# Patient Record
Sex: Male | Born: 1955 | Race: White | Hispanic: No | Marital: Married | Smoking: Never smoker
Health system: Southern US, Community
[De-identification: ages and names within clinical notes are randomized; demographics above are authoritative.]

## PROBLEM LIST (undated history)

## (undated) DIAGNOSIS — I890 Lymphedema, not elsewhere classified: Secondary | ICD-10-CM

## (undated) DIAGNOSIS — C439 Malignant melanoma of skin, unspecified: Secondary | ICD-10-CM

## (undated) DIAGNOSIS — C801 Malignant (primary) neoplasm, unspecified: Secondary | ICD-10-CM

## (undated) DIAGNOSIS — E119 Type 2 diabetes mellitus without complications: Secondary | ICD-10-CM

## (undated) HISTORY — DX: Type 2 diabetes mellitus without complications: E11.9

## (undated) HISTORY — DX: Malignant (primary) neoplasm, unspecified: C80.1

## (undated) HISTORY — PX: EYE SURGERY: SHX253

## (undated) HISTORY — PX: OTHER SURGICAL HISTORY: SHX169

---

## 2015-01-12 LAB — HM COLONOSCOPY

## 2015-06-27 ENCOUNTER — Other Ambulatory Visit (HOSPITAL_COMMUNITY): Payer: Self-pay | Admitting: Oncology

## 2015-06-27 DIAGNOSIS — M1710 Unilateral primary osteoarthritis, unspecified knee: Secondary | ICD-10-CM | POA: Insufficient documentation

## 2015-06-27 DIAGNOSIS — M1712 Unilateral primary osteoarthritis, left knee: Secondary | ICD-10-CM | POA: Insufficient documentation

## 2015-06-27 DIAGNOSIS — C4362 Malignant melanoma of left upper limb, including shoulder: Secondary | ICD-10-CM

## 2015-06-30 ENCOUNTER — Encounter (HOSPITAL_COMMUNITY)
Admission: RE | Admit: 2015-06-30 | Discharge: 2015-06-30 | Disposition: A | Discharge status: Home / Self Care | Payer: 59 | Source: Ambulatory Visit | Attending: Oncology | Admitting: Oncology

## 2015-06-30 DIAGNOSIS — C4362 Malignant melanoma of left upper limb, including shoulder: Secondary | ICD-10-CM | POA: Diagnosis present

## 2015-06-30 LAB — GLUCOSE, CAPILLARY: GLUCOSE-CAPILLARY: 159 mg/dL — AB (ref 65–99)

## 2015-06-30 MED ORDER — FLUDEOXYGLUCOSE F - 18 (FDG) INJECTION
12.1000 | Freq: Once | INTRAVENOUS | Status: AC | PRN
  Administered 2015-06-30: 12.1 via INTRAVENOUS

## 2015-07-18 DIAGNOSIS — Z8619 Personal history of other infectious and parasitic diseases: Secondary | ICD-10-CM | POA: Insufficient documentation

## 2015-09-13 DIAGNOSIS — K529 Noninfective gastroenteritis and colitis, unspecified: Secondary | ICD-10-CM | POA: Insufficient documentation

## 2016-02-08 ENCOUNTER — Encounter (HOSPITAL_COMMUNITY): Payer: 59 | Attending: Hematology

## 2016-02-08 ENCOUNTER — Encounter (HOSPITAL_COMMUNITY): Payer: Self-pay | Admitting: Hematology

## 2016-02-08 ENCOUNTER — Encounter (HOSPITAL_COMMUNITY): Payer: 59 | Attending: Hematology | Admitting: Hematology

## 2016-02-08 VITALS — BP 124/73 | HR 73 | Temp 98.5°F | Resp 18 | Wt 218.4 lb

## 2016-02-08 DIAGNOSIS — C4362 Malignant melanoma of left upper limb, including shoulder: Secondary | ICD-10-CM | POA: Insufficient documentation

## 2016-02-08 DIAGNOSIS — I890 Lymphedema, not elsewhere classified: Secondary | ICD-10-CM | POA: Diagnosis not present

## 2016-02-08 DIAGNOSIS — L989 Disorder of the skin and subcutaneous tissue, unspecified: Secondary | ICD-10-CM

## 2016-02-08 DIAGNOSIS — C439 Malignant melanoma of skin, unspecified: Secondary | ICD-10-CM | POA: Insufficient documentation

## 2016-02-08 DIAGNOSIS — C779 Secondary and unspecified malignant neoplasm of lymph node, unspecified: Secondary | ICD-10-CM | POA: Insufficient documentation

## 2016-02-08 LAB — CBC WITH DIFFERENTIAL/PLATELET
BASOS PCT: 0 %
Basophils Absolute: 0 10*3/uL (ref 0.0–0.1)
EOS ABS: 0.3 10*3/uL (ref 0.0–0.7)
Eosinophils Relative: 4 %
HCT: 43.5 % (ref 39.0–52.0)
HEMOGLOBIN: 14.9 g/dL (ref 13.0–17.0)
Lymphocytes Relative: 28 %
Lymphs Abs: 2.3 10*3/uL (ref 0.7–4.0)
MCH: 32.6 pg (ref 26.0–34.0)
MCHC: 34.3 g/dL (ref 30.0–36.0)
MCV: 95.2 fL (ref 78.0–100.0)
MONO ABS: 0.8 10*3/uL (ref 0.1–1.0)
MONOS PCT: 9 %
NEUTROS PCT: 59 %
Neutro Abs: 4.7 10*3/uL (ref 1.7–7.7)
PLATELETS: 236 10*3/uL (ref 150–400)
RBC: 4.57 MIL/uL (ref 4.22–5.81)
RDW: 12.9 % (ref 11.5–15.5)
WBC: 8.1 10*3/uL (ref 4.0–10.5)

## 2016-02-08 LAB — COMPREHENSIVE METABOLIC PANEL
ALBUMIN: 4.2 g/dL (ref 3.5–5.0)
ALT: 16 U/L — ABNORMAL LOW (ref 17–63)
ANION GAP: 5 (ref 5–15)
AST: 16 U/L (ref 15–41)
Alkaline Phosphatase: 52 U/L (ref 38–126)
BUN: 16 mg/dL (ref 6–20)
CHLORIDE: 108 mmol/L (ref 101–111)
CO2: 26 mmol/L (ref 22–32)
Calcium: 9.4 mg/dL (ref 8.9–10.3)
Creatinine, Ser: 0.78 mg/dL (ref 0.61–1.24)
GFR calc Af Amer: >60 mL/min (ref >60)
Glucose, Bld: 134 mg/dL — ABNORMAL HIGH (ref 65–99)
POTASSIUM: 3.8 mmol/L (ref 3.5–5.1)
Sodium: 139 mmol/L (ref 135–145)
TOTAL PROTEIN: 7.2 g/dL (ref 6.5–8.1)
Total Bilirubin: 0.4 mg/dL (ref 0.3–1.2)

## 2016-02-08 LAB — RETICULOCYTES
RBC.: 4.57 MIL/uL (ref 4.22–5.81)
Retic Count, Absolute: 59.4 10*3/uL (ref 19.0–186.0)
Retic Ct Pct: 1.3 % (ref 0.4–3.1)

## 2016-02-08 LAB — LACTATE DEHYDROGENASE: LDH: 125 U/L (ref 98–192)

## 2016-02-08 LAB — TSH: TSH: 0.934 u[IU]/mL (ref 0.350–4.500)

## 2016-02-08 NOTE — Patient Instructions (Addendum)
[place] at [facility] Discharge Instructions  RECOMMENDATIONS MADE BY THE CONSULTANT AND ANY TEST RESULTS WILL BE SENT TO YOUR REFERRING PHYSICIAN.  You were seen by Dr. Irene Limbo today. Lab work today and again in 4 months. PET and CT scan in 1 week and 4 months. Office will schedule for you. Office will call you with results. Return to clinic in 4 months for follow-up. Call with any questions or concerns.   Thank you for choosing [place] at [facility] to provide your oncology and hematology care.  To afford each patient quality time with our provider, please arrive at least 15 minutes before your scheduled appointment time.   Beginning January 23rd 2017 lab work for the [facility] will be done in the  Main lab at [facility] on 1st floor. If you have a lab appointment with the [facility] please come in thru the  Main Entrance and check in at the main information desk  You need to re-schedule your appointment should you arrive 10 or more minutes late.  We strive to give you quality time with our providers, and arriving late affects you and other patients whose appointments are after yours.  Also, if you no show three or more times for appointments you may be dismissed from the clinic at the providers discretion.     Again, thank you for choosing [facility].  Our hope is that these requests will decrease the amount of time that you wait before being seen by our physicians.       _____________________________________________________________  Should you have questions after your visit to [facility], please contact our office at [phone] between the hours of 8:30 a.m. and 4:30 p.m.  Voicemails left after 4:30 p.m. will not be returned until the following business day.  For prescription refill requests, have your pharmacy contact our office.         Resources For Cancer Patients and their  Caregivers ? American Cancer Society: Can assist with transportation, wigs, general needs, runs Look Good Feel Better.        725-609-5728 ? Cancer Care: Provides financial assistance, online support groups, medication/co-pay assistance.  1-800-813-HOPE 989-382-7555) ? [place] Assists [place] cancer patients and their families through emotional , educational and financial support.  508-411-7266 ? [place] DSS Where to apply for food stamps, Medicaid and utility assistance. 380-634-8112 ? [employer]: Transportation to medical appointments. 4793627020 ? Social Security Administration: May apply for disability if have a Stage IV cancer. 610 847 7759 712 112 3827 ? [employer], Disability and Transit Services: Assists with nutrition, care and transit needs. [place] Support Programs: @10RELATIVEDAYS @ > Cancer Support Group  2nd Tuesday of the month 1pm-2pm, Journey Room  > Creative Journey  3rd Tuesday of the month 1130am-1pm, Journey Room  > Look Good Feel Better  1st Wednesday of the month 10am-12 noon, Journey Room (Call [place] to register [phone])

## 2016-02-16 ENCOUNTER — Encounter (HOSPITAL_COMMUNITY): Payer: 59

## 2016-03-05 NOTE — Progress Notes (Signed)
Marland Kitchen    HEMATOLOGY/ONCOLOGY CONSULTATION NOTE  Date of Service: 02/08/2016  Patient Care Team: Rory Percy, MD as PCP - General (Family Medicine)  Melanoma surgery: [facility]  CHIEF COMPLAINTS/PURPOSE OF CONSULTATION:  Further evaluation and management of malignant melanoma.   HISTORY OF PRESENTING ILLNESS:   Luis Fitzgerald is a wonderful 60 y.o. 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    ONCOLOGIC HISTORY Melanoma of forearm, left (*)  01/07/2015 Initial Diagnosis  Melanoma of forearm, left (*) status post biopsy  02/08/2015 Surgery  Wide local excision with sentinel lymph node biopsy, SLN with isolated tumor cells in one out of one lymph node .  03/01/2015 Surgery  Completion lymph node dissection from the left axilla consisting of 10 level 1/2 lymph nodes, 2 level II lymph nodes and one level III lymph node neg  03/01/2015 - 03/07/2015 Cancer Staged  MRI scan of the brain negative for metastatic disease, PET/CT scan negative for metastatic disease. PET scan reveals "reactive" high cervical lymph node only, thereby giving him T3b, N1a, M0 (stage III B)  07/19/2015 - Targeted Therapy  Ipilimumab 10 mg/kg, first 07/19/2015 every 2 weeks  Patient received 2 doses of ipilimumab 10 mg/kg as adjuvant therapy [last dose in January 2017] but developed grade 3 toxicity of the skin and high-grade fevers requiring high-dose steroid therapy for about 5 weeks. He has since been tapered off the steroids.  He was last seen by Dr. Tressie Stalker on 11/21/2015.   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 He does have some issues with left upper extremity lymphedema for which he has a lymphedema sleeve that he does not appear to be using regularly. He uses lymphedema pumps 3 times a week. He has elevated blood sugars and notes that he is trying to follow a diabetic  diet. Follows with Dr. Nevada Crane for his dermatology follow-up and skin screening. Reports that he saw him for a left scalp lesion around April May 2017 - pathology results not available to [place] at this time. Has been trying to take appropriate sun precautions. Notes issues with chronic knee pains for which he reports he has received cortisone shots.  Patient is here for his continued follow-up and management. He notes no acute new focal symptoms. Has not noted any new progressive or concerning skin lesions.   MEDICAL HISTORY:  Past Medical History:  Diagnosis Date  . Cancer (HCC)    melanoma  -Grade 3 rash due to Ipilimumab -Stage IIIB melanoma with oncologic history as noted above -Diabetes mellitus -Dyslipidemia -Blind in the left eye -Arthritis  SURGICAL HISTORY:  Right hand surgery Left forearm melanoma wide local excision Knee arthroscopy Brain surgery at a 5 or 39yrs to find out if he had an optic nerve tumor   SOCIAL HISTORY: Social History   Social History  . Marital status: Married    Spouse name: N/A  . Number of children: N/A  . Years of education: N/A   Occupational History  . Not on file.   Social History Main Topics  . Smoking status: Never Smoker  . Smokeless tobacco: Never Used  . Alcohol use No  . Drug use: No  . Sexual activity: Yes   Other Topics Concern  . Not on file   Social History Narrative  . No narrative on file  No significant alcohol use Lifelong nonsmoker  [place]  FAMILY HISTORY: Does not report any family history of cancers, bleeding disorders or clotting disorders.  ALLERGIES:  is allergic to other and pecan extract allergy skin test. 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 acetaminophen (TYLENOL) 500 MG tablet Take 1,000 mg by mouth.    Marland Kitchen aspirin (GOODSENSE ASPIRIN) 81 MG chewable tablet Chew 81 mg by mouth.    Marland Kitchen atorvastatin (LIPITOR) 10 MG tablet  Take 10 mg by mouth.    . canagliflozin (INVOKANA) 100 MG TABS tablet Take 100 mg by mouth.    . celecoxib (CELEBREX) 200 MG capsule Take 200 mg by mouth.    Marland Kitchen glipiZIDE (GLUCOTROL XL) 10 MG 24 hr tablet Take 10 mg by mouth.    . metFORMIN (GLUCOPHAGE-XR) 500 MG 24 hr tablet Take 2,000 mg by mouth.    . zolpidem (AMBIEN) 10 MG tablet Take 10 mg by mouth.     No current facility-administered medications for this visit.     REVIEW OF SYSTEMS:    10 Point review of Systems was done is negative except as noted above.  PHYSICAL EXAMINATION: ECOG PERFORMANCE STATUS: 1 - Symptomatic but completely ambulatory  . Vitals:   02/08/16 1513  BP: 124/73  Pulse: 73  Resp: 18  Temp: 98.5 F (36.9 C)   Filed Weights   02/08/16 1513  Weight: 218 lb 6.4 oz (99.1 kg)   .There is no height or weight on file to calculate BMI.  GENERAL:alert, in no acute distress and comfortable SKIN: skin color, texture, turgor are normal, no rashes or significant lesions EYES: normal, conjunctiva are pink and non-injected, sclera clear OROPHARYNX:no exudate, no erythema and lips, buccal mucosa, and tongue normal  NECK: supple, no JVD, thyroid normal size, non-tender, without nodularity LYMPH:  no palpable lymphadenopathy in the cervical, axillary or inguinal LUNGS: clear to auscultation with normal respiratory effort HEART: regular rate & rhythm,  no murmurs and no lower extremity edema ABDOMEN: abdomen soft, non-tender, normoactive bowel sounds  Musculoskeletal: Healed surgical scar on left forearm. PSYCH: alert & oriented x 3 with fluent speech NEURO: no focal motor/sensory deficits  LABORATORY DATA:  I have reviewed the data as listed  . CBC Latest Ref Rng & Units 02/08/2016  WBC 4.0 - 10.5 K/uL 8.1  Hemoglobin 13.0 - 17.0 g/dL 14.9  Hematocrit 39.0 - 52.0 % 43.5  Platelets 150 - 400 K/uL 236    . CMP Latest Ref Rng & Units 02/08/2016  Glucose 65 - 99 mg/dL 134(H)  BUN 6 - 20 mg/dL 16    Creatinine 0.61 - 1.24 mg/dL 0.78  Sodium 135 - 145 mmol/L 139  Potassium 3.5 - 5.1 mmol/L 3.8  Chloride 101 - 111 mmol/L 108  CO2 22 - 32 mmol/L 26  Calcium 8.9 - 10.3 mg/dL 9.4  Total Protein 6.5 - 8.1 g/dL 7.2  Total Bilirubin 0.3 - 1.2 mg/dL 0.4  Alkaline Phos 38 - 126 U/L 52  AST 15 - 41 U/L 16  ALT 17 - 63 U/L 16(L)   Component     Latest Ref Rng & Units 02/08/2016  Retic Ct Pct     0.4 - 3.1 % 1.3  RBC.     4.22 - 5.81 MIL/uL 4.57  Retic Count, Manual     19.0 - 186.0 K/uL 59.4  LDH     98 - 192 U/L 125  TSH     0.350 - 4.500 uIU/mL  0.934          RADIOGRAPHIC STUDIES: I have personally reviewed the radiological images as listed and agreed with the findings in the report.  PET/CT scan in 06/30/2015  IMPRESSION: 1. Nonspecific amorphous hypermetabolism in the lateral aspect of the left proximal forearm, not covered on the CT portion of the examination. Correlation with physical examination is recommended. No other sites of hypermetabolism are noted elsewhere in the body to suggest metastatic disease in this patient. 2. Additional incidental findings, as above.   Electronically Signed   By: Vinnie Langton M.D.   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 One out of one sentinel lymph node positive for isolated melanoma cells. Completion left axillary lymph node dissection with negative lymph nodes. Patient treated with adjuvant Ipilimumab 10 mg/kg and tolerated only 2 doses after which she developed high-grade fevers and generalized grade 3-leading to discontinuation of his treatment. Required high-dose steroid for about 5-6 weeks. Last PET/CT scan in December 2016 negative for metastatic disease.  #2 Left upper extremity lymphedema likely secondary  to lymph node resection. -He uses a lymphedema sleeve intermittently and lymphedema comes 3 times a week.  PLAN -Patient has no clinical evidence of disease progression at this time. LDH level within normal limits. -TSH done due to recent anti CTLA therapy - WNL -We'll repeat a PET/CT scan in the next few weeks for active surveillance of his high-risk melanoma. -Will ask our nurse to get the pathology results and dermatology notes from Dr. Nevada Crane regarding his left scalp lesion. -Continue using left upper exudative lymphedema sleeve and lymphedema pumps. Offered to give him a referral to the cancer rehabilitation clinic if needed for continued management of his lymphedema.  -Patient will return to clinic with Dr. Whitney Muse in 4 months with repeat CBC, CMP, LDH and PET CT scan . Earlier if the currently ordered PET/CT scan has any new concerns .  All of the patients questions were answered with apparent satisfaction. The patient knows to call the clinic with any problems, questions or concerns.  I spent 60 minutes counseling the patient face to face. The total time spent in the appointment was 60 minutes and more than 50% was on counseling and direct patient cares.    Sullivan Lone MD [place] [facility] Hematology/Oncology Physician [facility]  (Office):       [phone] (Work cell):  [phone] (Fax):           [phone]

## 2016-06-10 ENCOUNTER — Encounter (HOSPITAL_COMMUNITY)
Admission: RE | Admit: 2016-06-10 | Discharge: 2016-06-10 | Disposition: A | Discharge status: Home / Self Care | Payer: 59 | Source: Ambulatory Visit | Attending: Hematology | Admitting: Hematology

## 2016-06-10 DIAGNOSIS — C4362 Malignant melanoma of left upper limb, including shoulder: Secondary | ICD-10-CM | POA: Diagnosis present

## 2016-06-10 DIAGNOSIS — C779 Secondary and unspecified malignant neoplasm of lymph node, unspecified: Secondary | ICD-10-CM | POA: Insufficient documentation

## 2016-06-10 LAB — GLUCOSE, CAPILLARY: Glucose-Capillary: 142 mg/dL — ABNORMAL HIGH (ref 65–99)

## 2016-06-10 MED ORDER — FLUDEOXYGLUCOSE F - 18 (FDG) INJECTION
10.6800 | Freq: Once | INTRAVENOUS | Status: AC | PRN
  Administered 2016-06-10: 10.68 via INTRAVENOUS

## 2016-06-12 ENCOUNTER — Encounter (HOSPITAL_COMMUNITY): Payer: 59 | Attending: Hematology & Oncology | Admitting: Hematology & Oncology

## 2016-06-12 ENCOUNTER — Encounter (HOSPITAL_COMMUNITY): Payer: 59

## 2016-06-12 ENCOUNTER — Encounter (HOSPITAL_COMMUNITY): Payer: Self-pay | Admitting: Hematology & Oncology

## 2016-06-12 VITALS — BP 133/70 | HR 67 | Temp 98.0°F | Resp 16 | Wt 221.4 lb

## 2016-06-12 DIAGNOSIS — C779 Secondary and unspecified malignant neoplasm of lymph node, unspecified: Secondary | ICD-10-CM | POA: Diagnosis not present

## 2016-06-12 DIAGNOSIS — Z0000 Encounter for general adult medical examination without abnormal findings: Secondary | ICD-10-CM

## 2016-06-12 DIAGNOSIS — C4362 Malignant melanoma of left upper limb, including shoulder: Secondary | ICD-10-CM

## 2016-06-12 DIAGNOSIS — I890 Lymphedema, not elsewhere classified: Secondary | ICD-10-CM

## 2016-06-12 DIAGNOSIS — R948 Abnormal results of function studies of other organs and systems: Secondary | ICD-10-CM

## 2016-06-12 DIAGNOSIS — Z23 Encounter for immunization: Secondary | ICD-10-CM | POA: Diagnosis not present

## 2016-06-12 LAB — COMPREHENSIVE METABOLIC PANEL
ALT: 16 U/L — ABNORMAL LOW (ref 17–63)
ANION GAP: 7 (ref 5–15)
AST: 16 U/L (ref 15–41)
Albumin: 4.2 g/dL (ref 3.5–5.0)
Alkaline Phosphatase: 59 U/L (ref 38–126)
BILIRUBIN TOTAL: 0.4 mg/dL (ref 0.3–1.2)
BUN: 17 mg/dL (ref 6–20)
CO2: 26 mmol/L (ref 22–32)
Calcium: 9 mg/dL (ref 8.9–10.3)
Chloride: 103 mmol/L (ref 101–111)
Creatinine, Ser: 0.99 mg/dL (ref 0.61–1.24)
Glucose, Bld: 206 mg/dL — ABNORMAL HIGH (ref 65–99)
POTASSIUM: 4.6 mmol/L (ref 3.5–5.1)
Sodium: 136 mmol/L (ref 135–145)
TOTAL PROTEIN: 7 g/dL (ref 6.5–8.1)

## 2016-06-12 LAB — CBC WITH DIFFERENTIAL/PLATELET
BASOS ABS: 0 10*3/uL (ref 0.0–0.1)
BASOS PCT: 0 %
Eosinophils Absolute: 0.2 10*3/uL (ref 0.0–0.7)
Eosinophils Relative: 3 %
HEMATOCRIT: 44.2 % (ref 39.0–52.0)
Hemoglobin: 15.3 g/dL (ref 13.0–17.0)
Lymphocytes Relative: 26 %
Lymphs Abs: 2.4 10*3/uL (ref 0.7–4.0)
MCH: 33.7 pg (ref 26.0–34.0)
MCHC: 34.6 g/dL (ref 30.0–36.0)
MCV: 97.4 fL (ref 78.0–100.0)
MONO ABS: 0.7 10*3/uL (ref 0.1–1.0)
Monocytes Relative: 7 %
NEUTROS ABS: 5.8 10*3/uL (ref 1.7–7.7)
NEUTROS PCT: 64 %
Platelets: 264 10*3/uL (ref 150–400)
RBC: 4.54 MIL/uL (ref 4.22–5.81)
RDW: 12.5 % (ref 11.5–15.5)
WBC: 9.1 10*3/uL (ref 4.0–10.5)

## 2016-06-12 LAB — LACTATE DEHYDROGENASE: LDH: 133 U/L (ref 98–192)

## 2016-06-12 MED ORDER — INFLUENZA VAC SPLIT QUAD 0.5 ML IM SUSY
0.5000 mL | PREFILLED_SYRINGE | Freq: Once | INTRAMUSCULAR | Status: AC
  Administered 2016-06-12: 0.5 mL via INTRAMUSCULAR

## 2016-06-12 NOTE — Progress Notes (Signed)
Marland Kitchen    HEMATOLOGY/ONCOLOGY PROGRESS NOTE  Date of Service: 02/08/2016  Patient Care Team: Rory Percy, MD as PCP - General (Family Medicine)  Melanoma surgery: [facility]  CHIEF COMPLAINTS/PURPOSE OF CONSULTATION:  Further evaluation and management of malignant melanoma.   HISTORY OF PRESENTING ILLNESS:   Luis Fitzgerald is a wonderful 60 y.o. 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 His initial visit at [facility] was with Dr. Irene Limbo. PET/CT was ordered for ongoing follow-up. Patient presents to review studies.   Luis Fitzgerald is here today accompanied by his wife.  I have reviewed the labs and scans with the patient. Results are detailed below.   He had a slight cold when he got his PET scan.   He says he feels good "for the most part".   He has lymphedema of the LUE that he says gets in the way sometimes. He goes to Physical Therapy for it. Dr. Cena Benton did his surgery.   He has not had his flu shot. He has not had his shingles vaccine.   Appetite is unchanged. Energy is baseline. No other complaints.   ONCOLOGIC HISTORY Melanoma of forearm, left (*)  01/07/2015 Initial Diagnosis  Melanoma of forearm, left (*) status post biopsy  02/08/2015 Surgery  Wide local excision with sentinel lymph node biopsy, SLN with isolated tumor cells in one out of one lymph node .  03/01/2015 Surgery  Completion lymph node dissection from the left axilla consisting of 10 level 1/2 lymph nodes, 2 level II lymph nodes and one level III lymph node neg  03/01/2015 - 03/07/2015 Cancer Staged  MRI scan of the brain negative for metastatic disease, PET/CT scan negative for metastatic disease. PET scan reveals "reactive" high cervical lymph node only, thereby giving him T3b, N1a, M0 (stage III B)  07/19/2015 - Targeted Therapy  Ipilimumab 10 mg/kg, first 07/19/2015 every 2 weeks  Patient received 2 doses of ipilimumab 10  mg/kg as adjuvant therapy [last dose in January 2017] but developed grade 3 toxicity of the skin and high-grade fevers requiring high-dose steroid therapy for about 5 weeks. He has since been tapered off the steroids.  He was last seen by Dr. Tressie Stalker on 11/21/2015.   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 He does have some issues with left upper extremity lymphedema for which he has a lymphedema sleeve that he does not appear to be using regularly. He uses lymphedema pumps 3 times a week. He has elevated blood sugars and notes that he is trying to follow a diabetic diet. Follows with Dr. Nevada Crane for his dermatology follow-up and skin screening. Reports that he saw him for a left scalp lesion around April May 2017 - pathology results not available to [place] at this time. Has been trying to take appropriate sun precautions. Notes issues with chronic knee pains for which he reports he has received cortisone shots.  Patient is here for his continued follow-up and management. He notes no acute new focal symptoms. Has not noted any new progressive or concerning skin lesions.   MEDICAL HISTORY:  Past Medical History:  Diagnosis Date  . Cancer (HCC)    melanoma  -Grade 3 rash due to Ipilimumab -Stage IIIB melanoma with oncologic history as noted above -Diabetes mellitus -Dyslipidemia -Blind in the left eye -Arthritis  SURGICAL HISTORY:  Right hand surgery Left forearm melanoma wide  local excision Knee arthroscopy Brain surgery at a 5 or 38yrs to find out if he had an optic nerve tumor   SOCIAL HISTORY: Social History   Social History  . Marital status: Married    Spouse name: N/A  . Number of children: N/A  . Years of education: N/A   Occupational History  . Not on file.   Social History Main Topics  . Smoking status: Never Smoker  . Smokeless tobacco: Never Used  . Alcohol use No  . Drug use: No  . Sexual activity: Yes   Other Topics  Concern  . Not on file   Social History Narrative  . No narrative on file  No significant alcohol use Lifelong nonsmoker [place]  FAMILY HISTORY: Does not report any family history of cancers, bleeding disorders or clotting disorders.  ALLERGIES:  is allergic to other and pecan extract allergy skin test. 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 acetaminophen (TYLENOL) 500 MG tablet Take 1,000 mg by mouth.    Marland Kitchen aspirin (GOODSENSE ASPIRIN) 81 MG chewable tablet Chew 81 mg by mouth.    Marland Kitchen atorvastatin (LIPITOR) 10 MG tablet Take 10 mg by mouth.    . canagliflozin (INVOKANA) 100 MG TABS tablet Take 100 mg by mouth.    . celecoxib (CELEBREX) 200 MG capsule Take 200 mg by mouth.    Marland Kitchen glipiZIDE (GLUCOTROL XL) 10 MG 24 hr tablet Take 10 mg by mouth.    . metFORMIN (GLUCOPHAGE-XR) 500 MG 24 hr tablet Take 2,000 mg by mouth.    . zolpidem (AMBIEN) 10 MG tablet Take 10 mg by mouth.     No current facility-administered medications for this visit.     REVIEW OF SYSTEMS:   Review of Systems  Constitutional: Negative.   HENT: Negative.   Eyes: Negative.   Respiratory: Negative.   Cardiovascular: Negative.   Gastrointestinal: Negative.   Genitourinary: Negative.   Musculoskeletal: Negative.        Swollen left arm (lymphedema)   Skin: Negative.   Neurological: Negative.   Endo/Heme/Allergies: Negative.   Psychiatric/Behavioral: Negative.   All other systems reviewed and are negative. 14 point review of systems was performed and is negative except as detailed under history of present illness and above   PHYSICAL EXAMINATION: ECOG PERFORMANCE STATUS: 1 - Symptomatic but completely ambulatory  . Vitals:   06/12/16 1400  BP: 133/70  Pulse: 67  Resp: 16  Temp: 98 F (36.7 C)   Filed Weights   06/12/16 1400  Weight: 221 lb 6.4 oz (100.4 kg)   .There is no height or weight on file to calculate BMI.     Physical Exam  Constitutional: He is oriented to person, place, and time and well-developed, well-nourished, and in no distress.  Patient was able to get on exam table without assistance.   HENT:  Head: Normocephalic and atraumatic.  Mouth/Throat: Oropharynx is clear and moist.  Eyes: Conjunctivae and EOM are normal. Pupils are equal, round, and reactive to light.  Neck: Normal range of motion. Neck supple.  Cardiovascular: Normal rate, regular rhythm and normal heart sounds.   Pulmonary/Chest: Effort normal and breath sounds normal.  Abdominal: Soft. Bowel sounds are normal.  Musculoskeletal: Normal range of motion.  Extreme left upper extremity lymphedema.   Neurological: He is alert and oriented to person, place, and time. Gait normal.  Skin: Skin is warm and dry.  Psychiatric: Mood normal.  Patient was joking and laughing.   Nursing note and vitals reviewed.   LABORATORY DATA:  I have reviewed the data as listed  . CBC Latest Ref Rng & Units 06/12/2016 02/08/2016  WBC 4.0 - 10.5 K/uL 9.1 8.1  Hemoglobin 13.0 - 17.0 g/dL 15.3 14.9  Hematocrit 39.0 - 52.0 % 44.2 43.5  Platelets 150 - 400 K/uL 264 236    . CMP Latest Ref Rng & Units 06/12/2016 02/08/2016  Glucose 65 - 99 mg/dL 206(H) 134(H)  BUN 6 - 20 mg/dL 17 16  Creatinine 0.61 - 1.24 mg/dL 0.99 0.78  Sodium 135 - 145 mmol/L 136 139  Potassium 3.5 - 5.1 mmol/L 4.6 3.8  Chloride 101 - 111 mmol/L 103 108  CO2 22 - 32 mmol/L 26 26  Calcium 8.9 - 10.3 mg/dL 9.0 9.4  Total Protein 6.5 - 8.1 g/dL 7.0 7.2  Total Bilirubin 0.3 - 1.2 mg/dL 0.4 0.4  Alkaline Phos 38 - 126 U/L 59 52  AST 15 - 41 U/L 16 16  ALT 17 - 63 U/L 16(L) 16(L)   Component     Latest Ref Rng & Units 02/08/2016  Retic Ct Pct     0.4 - 3.1 % 1.3  RBC.     4.22 - 5.81 MIL/uL 4.57  Retic Count, Manual     19.0 - 186.0 K/uL 59.4  LDH     98 - 192 U/L 125  TSH     0.350 - 4.500 uIU/mL 0.934   Results for KAGE, DULL (MRN [id number])   Ref.  Range 06/12/2016 12:48  LDH Latest Ref Range: 98 - 192 U/L 133         RADIOGRAPHIC STUDIES: I have personally reviewed the radiological images as listed and agreed with the findings in the report.  Study Result   CLINICAL DATA:  Subsequent treatment strategy for melanoma.  EXAM: NUCLEAR MEDICINE PET WHOLE BODY  TECHNIQUE: 10.7 mCi F-18 FDG was injected intravenously. Full-ring PET imaging was performed from the vertex to the feet after the radiotracer. CT data was obtained and used for attenuation correction and anatomic localization.  FASTING BLOOD GLUCOSE:  Value: 142 mg/dl  COMPARISON:  06/30/2015  FINDINGS: NECK  No hypermetabolic lymph nodes in the neck.  CHEST  Focal FDG uptake is identified and mediastinal lymph nodes on today's study. 9 mm short axis AP window lymph node (image 93 series 3) is unchanged in size in the interval but shows FDG accumulation with SUV max = of 5.0. 7 mm short axis precarinal lymph node (image 94 series 3) also shows hypermetabolic FDG uptake with SUV max = 4.3. Focal hypermetabolism in the right hilum demonstrates SUV max = 7.8 although no discrete right hilar lymph node can be identified on this noncontrast exam. Similarly, there is hypermetabolism in the left hilum focally with SUV max = measuring up to 5.9.  Surgical clips left axilla consistent with prior lymphadenectomy.  ABDOMEN/PELVIS  No abnormal hypermetabolic activity within the liver, pancreas, adrenal glands, or spleen. Typical mottled FDG uptake is noted in the liver without a discrete or discernible hypermetabolic liver lesion evident. No hypermetabolic lymph nodes in the abdomen or pelvis.  There is abdominal aortic atherosclerosis without aneurysm.  SKELETON  No focal hypermetabolic activity to suggest skeletal metastasis. There is somewhat diffuse low level uptake again identified in the left forearm, slightly less prominent than  previously. CT imaging today shows subcutaneous edema in this region diffusely in the patient has a history of  left upper extremity lymphedema. This could account for slower clearance from the soft tissues of the left forearm. No focal hypermetabolic lesion identified in the left upper extremity.  IMPRESSION: 1. Interval development of hypermetabolic mediastinal lymph nodes with focal hypermetabolic foci in the hilar regions bilaterally. The visible mediastinal lymph nodes on today's study have not increased in size since prior study. CT chest with contrast would likely prove helpful to better evaluate the hilar regions for lymphadenopathy as metastatic disease is a concern. 2. No discrete or definite hypermetabolic liver lesion evident on today's exam. 3. Diffusely increased uptake in the soft tissues the left forearm with underlying subcutaneous edema evident on CT imaging.   Electronically Signed   By: Misty Stanley M.D.   On: 06/10/2016 09:55       ASSESSMENT & PLAN:   60 year old Caucasian male with  #1 high-risk stage IIIB malignant melanoma of the left forearm Patient is status post treatment as detailed above with wide local excision of the left forearm lesion. One out of one sentinel lymph node positive for isolated melanoma cells. Completion left axillary lymph node dissection with negative lymph nodes. Patient treated with adjuvant Ipilimumab 10 mg/kg and tolerated only 2 doses after which she developed high-grade fevers and generalized grade 3-leading to discontinuation of his treatment. Required high-dose steroid for about 5-6 weeks. Last PET/CT scan in December 2016 negative for metastatic disease. Current PET/CT reviewed with the patient in detail. I have ordered a CT of the chest as recommended by radiology. Patient is just getting over a URI.  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 -He uses a lymphedema sleeve  intermittently and lymphedema comes 3 times a week.  PLAN -. LDH level within normal limits. -TSH done due to recent anti CTLA therapy - WNL -CT chest as noted above with follow-up post. -Will ask our nurse to get the pathology results and dermatology notes from Dr. Nevada Crane regarding his left scalp lesion. -Continue using left upper exudative lymphedema sleeve and lymphedema pumps. Offered to give him a referral to the cancer rehabilitation clinic if needed for continued management of his lymphedema..  All of the patients questions were answered with apparent satisfaction. The patient knows to call the clinic with any problems, questions or concerns.  He will get a flu shot after our visit today.  We discussed the risks associated with lymphedema if he doesn't take care of it.   He will return for a follow up after his chest CT. If everything is normal, he will return in 3 months.   All of the patient's questions have been answered and he knows to call the clinic if he has any questions or concerns.    This document serves as a record of services personally performed by Ancil Linsey, MD. It was created on her behalf by Martinique Casey, a trained medical scribe. The creation of this record is based on the scribe's personal observations and the provider's statements to them. This document has been checked and approved by the attending provider.  I have reviewed the above documentation for accuracy and completeness and I agree with the above.  This note was electronically signed. Molli Hazard, MD

## 2016-06-12 NOTE — Patient Instructions (Addendum)
[place] at [facility] Discharge Instructions  RECOMMENDATIONS MADE BY THE CONSULTANT AND ANY TEST RESULTS WILL BE SENT TO YOUR REFERRING PHYSICIAN.  You saw Dr.Penland today. Flu Shot today. Chest CT with contrast. Follow up after chest CT- hopefully it will be same day. See Amy at checkout for appointments.  Thank you for choosing [place] at [facility] to provide your oncology and hematology care.  To afford each patient quality time with our provider, please arrive at least 15 minutes before your scheduled appointment time.   Beginning January 23rd 2017 lab work for the [facility] will be done in the  Main lab at [facility] on 1st floor. If you have a lab appointment with the [facility] please come in thru the  Main Entrance and check in at the main information desk  You need to re-schedule your appointment should you arrive 10 or more minutes late.  We strive to give you quality time with our providers, and arriving late affects you and other patients whose appointments are after yours.  Also, if you no show three or more times for appointments you may be dismissed from the clinic at the providers discretion.     Again, thank you for choosing [facility].  Our hope is that these requests will decrease the amount of time that you wait before being seen by our physicians.       _____________________________________________________________  Should you have questions after your visit to [facility], please contact our office at [phone] between the hours of 8:30 a.m. and 4:30 p.m.  Voicemails left after 4:30 p.m. will not be returned until the following business day.  For prescription refill requests, have your pharmacy contact our office.         Resources For Cancer Patients and their Caregivers ? American Cancer Society: Can assist with transportation, wigs, general needs, runs Look Good Feel  Better.        ([phone] [phone] ? Cancer Care: Provides financial assistance, online support groups, medication/co-pay assistance.  1-800-813-HOPE 206 839 1671) ? [place] Assists [place] cancer patients and their families through emotional , educational and financial support.  539-705-1312 ? [place] DSS Where to apply for food stamps, Medicaid and utility assistance. 330 006 6050 ? RCATS: Transportation to medical appointments. (585) 770-6207 ? Social Security Administration: May apply for disability if have a Stage IV cancer. (920)261-9975 907-017-0202 ? [employer], Disability and Transit Services: Assists with nutrition, care and transit needs. [place] Support Programs: @10RELATIVEDAYS @ > Cancer Support Group  2nd Tuesday of the month 1pm-2pm, Journey Room  > Creative Journey  3rd Tuesday of the month 1130am-1pm, Journey Room  > Look Good Feel Better  1st Wednesday of the month 10am-12 noon, Journey Room (Call American Cancer Society to register [phone])   Influenza Virus Vaccine injection (Fluarix) What is this medicine? INFLUENZA VIRUS VACCINE (in floo EN zuh VAHY ruhs vak SEEN) helps to reduce the risk of getting influenza also known as the flu. This medicine may be used for other purposes; ask your health care provider or pharmacist if you have questions. COMMON BRAND NAME(S): Fluarix, Fluzone What should I tell my health care provider before I take this medicine? They need to know if you have any of these conditions: -bleeding disorder like hemophilia -fever or infection -Guillain-Barre syndrome or other neurological problems -immune system problems -infection with the human immunodeficiency virus (HIV)  or AIDS -low blood platelet counts -multiple sclerosis -an unusual or allergic reaction to influenza virus vaccine, eggs, chicken proteins, latex, gentamicin, other medicines, foods, dyes or  preservatives -pregnant or trying to get pregnant -breast-feeding How should I use this medicine? This vaccine is for injection into a muscle. It is given by a health care professional. A copy of Vaccine Information Statements will be given before each vaccination. Read this sheet carefully each time. The sheet may change frequently. Talk to your pediatrician regarding the use of this medicine in children. Special care may be needed. Overdosage: If you think you have taken too much of this medicine contact a poison control center or emergency room at once. NOTE: This medicine is only for you. Do not share this medicine with others. What if I miss a dose? This does not apply. What may interact with this medicine? -chemotherapy or radiation therapy -medicines that lower your immune system like etanercept, anakinra, infliximab, and adalimumab -medicines that treat or prevent blood clots like warfarin -phenytoin -steroid medicines like prednisone or cortisone -theophylline -vaccines This list may not describe all possible interactions. Give your health care provider a list of all the medicines, herbs, non-prescription drugs, or dietary supplements you use. Also tell them if you smoke, drink alcohol, or use illegal drugs. Some items may interact with your medicine. What should I watch for while using this medicine? Report any side effects that do not go away within 3 days to your doctor or health care professional. Call your health care provider if any unusual symptoms occur within 6 weeks of receiving this vaccine. You may still catch the flu, but the illness is not usually as bad. You cannot get the flu from the vaccine. The vaccine will not protect against colds or other illnesses that may cause fever. The vaccine is needed every year. What side effects may I notice from receiving this medicine? Side effects that you should report to your doctor or health care professional as soon as  possible: -allergic reactions like skin rash, itching or hives, swelling of the face, lips, or tongue Side effects that usually do not require medical attention (report to your doctor or health care professional if they continue or are bothersome): -fever -headache -muscle aches and pains -pain, tenderness, redness, or swelling at site where injected -weak or tired This list may not describe all possible side effects. Call your doctor for medical advice about side effects. You may report side effects to FDA at 1-800-FDA-1088. Where should I keep my medicine? This vaccine is only given in a clinic, pharmacy, doctor's office, or other health care setting and will not be stored at home. NOTE: This sheet is a summary. It may not cover all possible information. If you have questions about this medicine, talk to your doctor, pharmacist, or health care provider.  2017 Elsevier/Gold Standard (2008-02-10 09:30:40)

## 2016-06-12 NOTE — Progress Notes (Signed)
Carole Civil presents today for injection per MD orders. Flu Vaccine administered IM in right Upper Arm. Administration without incident. Patient tolerated well.

## 2016-06-18 ENCOUNTER — Encounter (HOSPITAL_BASED_OUTPATIENT_CLINIC_OR_DEPARTMENT_OTHER): Payer: 59 | Admitting: Hematology & Oncology

## 2016-06-18 ENCOUNTER — Ambulatory Visit (HOSPITAL_COMMUNITY)
Admission: RE | Admit: 2016-06-18 | Discharge: 2016-06-18 | Disposition: A | Discharge status: Home / Self Care | Payer: 59 | Source: Ambulatory Visit | Attending: Hematology & Oncology | Admitting: Hematology & Oncology

## 2016-06-18 ENCOUNTER — Encounter (HOSPITAL_COMMUNITY): Payer: Self-pay | Admitting: Hematology & Oncology

## 2016-06-18 VITALS — BP 137/79 | HR 60 | Temp 97.8°F | Resp 16 | Wt 220.8 lb

## 2016-06-18 DIAGNOSIS — I890 Lymphedema, not elsewhere classified: Secondary | ICD-10-CM | POA: Diagnosis not present

## 2016-06-18 DIAGNOSIS — R948 Abnormal results of function studies of other organs and systems: Secondary | ICD-10-CM

## 2016-06-18 DIAGNOSIS — I2510 Atherosclerotic heart disease of native coronary artery without angina pectoris: Secondary | ICD-10-CM | POA: Insufficient documentation

## 2016-06-18 DIAGNOSIS — C779 Secondary and unspecified malignant neoplasm of lymph node, unspecified: Secondary | ICD-10-CM | POA: Diagnosis not present

## 2016-06-18 DIAGNOSIS — C4362 Malignant melanoma of left upper limb, including shoulder: Secondary | ICD-10-CM | POA: Diagnosis not present

## 2016-06-18 DIAGNOSIS — C439 Malignant melanoma of skin, unspecified: Secondary | ICD-10-CM

## 2016-06-18 MED ORDER — IOPAMIDOL (ISOVUE-300) INJECTION 61%
75.0000 mL | Freq: Once | INTRAVENOUS | Status: AC | PRN
  Administered 2016-06-18: 75 mL via INTRAVENOUS

## 2016-06-18 NOTE — Patient Instructions (Signed)
[place] at [facility] Discharge Instructions  RECOMMENDATIONS MADE BY THE CONSULTANT AND ANY TEST RESULTS WILL BE SENT TO YOUR REFERRING PHYSICIAN.  Exam with Dr. Virgil Slinger Muse today.   Ct scan prior to returning to see the doctor. Please see Amy for appointments.    Thank you for choosing [place] at [facility] to provide your oncology and hematology care.  To afford each patient quality time with our provider, please arrive at least 15 minutes before your scheduled appointment time.   Beginning January 23rd 2017 lab work for the [facility] will be done in the  Main lab at [facility] on 1st floor. If you have a lab appointment with the [facility] please come in thru the  Main Entrance and check in at the main information desk  You need to re-schedule your appointment should you arrive 10 or more minutes late.  We strive to give you quality time with our providers, and arriving late affects you and other patients whose appointments are after yours.  Also, if you no show three or more times for appointments you may be dismissed from the clinic at the providers discretion.     Again, thank you for choosing [facility].  Our hope is that these requests will decrease the amount of time that you wait before being seen by our physicians.       _____________________________________________________________  Should you have questions after your visit to [facility], please contact our office at [phone] between the hours of 8:30 a.m. and 4:30 p.m.  Voicemails left after 4:30 p.m. will not be returned until the following business day.  For prescription refill requests, have your pharmacy contact our office.         Resources For Cancer Patients and their Caregivers ? American Cancer Society: Can assist with transportation, wigs, general needs, runs Look Good Feel Better.        657-783-7646 ? Cancer  Care: Provides financial assistance, online support groups, medication/co-pay assistance.  1-800-813-HOPE 3657110456) ? [place] Assists [place] cancer patients and their families through emotional , educational and financial support.  548-596-2875 ? [place] DSS Where to apply for food stamps, Medicaid and utility assistance. 667-319-5625 ? RCATS: Transportation to medical appointments. (318)628-6021 ? Social Security Administration: May apply for disability if have a Stage IV cancer. (669)450-4318 515-305-4726 ? [employer], Disability and Transit Services: Assists with nutrition, care and transit needs. [place] Support Programs: @10RELATIVEDAYS @ > Cancer Support Group  2nd Tuesday of the month 1pm-2pm, Journey Room  > Creative Journey  3rd Tuesday of the month 1130am-1pm, Journey Room  > Look Good Feel Better  1st Wednesday of the month 10am-12 noon, Journey Room (Call [place] to register [phone])

## 2016-06-18 NOTE — Progress Notes (Signed)
HEMATOLOGY/ONCOLOGY PROGRESS NOTE  Date of Service: 02/08/2016  Patient Care Team: Luis Percy, MD as PCP - General (Family Medicine)  Melanoma surgery: Luis Fitzgerald [facility]  CHIEF COMPLAINTS/PURPOSE OF CONSULTATION:  Further evaluation and management of malignant melanoma.   HISTORY OF PRESENTING ILLNESS:   Luis Fitzgerald is a wonderful 60 y.o. 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 First visit at [facility] was with Luis. Irene Fitzgerald, PET CT was reviewed which showed: Interval development of hypermetabolic mediastinal lymph nodes with focal hypermetabolic foci in the hilar regions bilaterally. The visible mediastinal lymph nodes on today's study have not increased in size since prior study. CT chest with contrast would likely prove helpful to better evaluate the hilar regions for lymphadenopathy as metastatic disease is a concern.  Luis Fitzgerald returns to the [place] to review CT Chest from earlier today. I personally reviewed and went over these imaging studies with the patient.  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 He notes that this is improving. No cough, no worsening SOB. No new pain. No headaches.  Appetite remains unchanged.  Energy is baseline.   ONCOLOGIC HISTORY Melanoma of forearm, left (*)  01/07/2015 Initial Diagnosis  Melanoma of forearm, left (*) status post biopsy  02/08/2015 Surgery  Wide local excision with sentinel lymph node biopsy, SLN with isolated tumor cells in one out of one lymph node .  03/01/2015 Surgery  Completion lymph node dissection from the left axilla consisting of 10 level 1/2 lymph nodes, 2 level II lymph nodes and one level III lymph node neg  03/01/2015 - 03/07/2015 Cancer Staged  MRI scan of the brain negative for metastatic disease, PET/CT scan negative for metastatic disease. PET scan reveals "reactive" high cervical lymph node only, thereby giving  him T3b, N1a, M0 (stage III B)  07/19/2015 - Targeted Therapy  Ipilimumab 10 mg/kg, first 07/19/2015 every 2 weeks  Patient received 2 doses of ipilimumab 10 mg/kg as adjuvant therapy [last dose in January 2017] but developed grade 3 toxicity of the skin and high-grade fevers requiring high-dose steroid therapy for about 5 weeks. He has since been tapered off the steroids.   MEDICAL HISTORY:  Past Medical History:  Diagnosis Date  . Cancer ([place])    melanoma  . Diabetes mellitus without complication (HCC)   -Grade 3 rash due to Ipilimumab -Stage IIIB melanoma with oncologic history as noted above -Diabetes mellitus -Dyslipidemia -Blind in the left eye -Arthritis  SURGICAL HISTORY:  Right hand surgery Left forearm melanoma wide local excision Knee arthroscopy Brain surgery at a 5 or 16yrs to find out if he had an optic nerve tumor   SOCIAL HISTORY: Social History   Social History  . Marital status: Married    Spouse name: N/A  . Number of children: N/A  . Years of education: N/A   Occupational History  . Not on file.   Social History Main Topics  . Smoking status: Never Smoker  . Smokeless tobacco: Never Used  . Alcohol use No  . Drug use: No  . Sexual activity: Yes   Other Topics Concern  . Not on file   Social History Narrative  . No narrative on file  No significant alcohol use Lifelong nonsmoker [place]  FAMILY HISTORY: Does not report any family history of cancers, bleeding disorders or clotting disorders.  ALLERGIES:  is allergic to other and pecan extract allergy skin test. 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 acetaminophen (TYLENOL) 500 MG tablet Take 1,000 mg by mouth.    Marland Kitchen aspirin (GOODSENSE ASPIRIN) 81 MG chewable tablet Chew 81 mg by mouth.    Marland Kitchen atorvastatin (LIPITOR) 10 MG tablet Take 10 mg by mouth.    . canagliflozin (INVOKANA) 100 MG TABS tablet Take 100 mg  by mouth.    . celecoxib (CELEBREX) 200 MG capsule Take 200 mg by mouth.    Marland Kitchen glipiZIDE (GLUCOTROL XL) 10 MG 24 hr tablet Take 10 mg by mouth.    . metFORMIN (GLUCOPHAGE-XR) 500 MG 24 hr tablet Take 2,000 mg by mouth.    . zolpidem (AMBIEN) 10 MG tablet Take 10 mg by mouth.     No current facility-administered medications for this visit.     REVIEW OF SYSTEMS:   Review of Systems  Constitutional: Negative.   HENT: Negative.   Eyes: Negative.   Respiratory: Negative.   Cardiovascular: Negative.   Gastrointestinal: Negative.   Genitourinary: Negative.   Musculoskeletal: Negative.   Skin: Negative.        Postherpetic neuralgia from shingles  Neurological: Negative.   Endo/Heme/Allergies: Negative.   Psychiatric/Behavioral: Negative.   All other systems reviewed and are negative. 14 point review of systems was performed and is negative except as detailed under history of present illness and above   PHYSICAL EXAMINATION: ECOG PERFORMANCE STATUS: 1 - Symptomatic but completely ambulatory  . Vitals:   06/18/16 1630  BP: 137/79  Pulse: 60  Resp: 16  Temp: 97.8 F (36.6 C)   Filed Weights   06/18/16 1630  Weight: 220 lb 12.8 oz (100.2 kg)   .There is no height or weight on file to calculate BMI.    Physical Exam  Constitutional: He is oriented to person, place, and time and well-developed, well-nourished, and in no distress.  Patient was able to get on exam table without assistance.   HENT:  Head: Normocephalic and atraumatic.  Mouth/Throat: Oropharynx is clear and moist.  Eyes: Conjunctivae and EOM are normal. Pupils are equal, round, and reactive to light. No scleral icterus.  Neck: Normal range of motion. Neck supple.  Cardiovascular: Normal rate, regular rhythm and normal heart sounds.   Pulmonary/Chest: Effort normal and breath sounds normal.  Abdominal: Soft. Bowel sounds are normal. He exhibits no distension and no mass. There is no tenderness. There is no  rebound and no guarding.  Musculoskeletal: Normal range of motion. He exhibits edema.  Extreme left upper extremity lymphedema.   Lymphadenopathy:    He has no cervical adenopathy.  Neurological: He is alert and oriented to person, place, and time. Gait normal.  Skin: Skin is warm and dry.  Psychiatric: Mood, affect and judgment normal.  Patient was joking and laughing.   Nursing note and vitals reviewed.   LABORATORY DATA:  I have reviewed the data as listed  CBC Latest Ref Rng & Units 06/12/2016 02/08/2016  WBC 4.0 - 10.5 K/uL 9.1 8.1  Hemoglobin 13.0 - 17.0 g/dL 15.3 14.9  Hematocrit 39.0 - 52.0 % 44.2 43.5  Platelets 150 - 400 K/uL 264 236    . CMP Latest Ref Rng & Units 06/12/2016 02/08/2016  Glucose 65 - 99 mg/dL 206(H) 134(H)  BUN 6 - 20 mg/dL 17 16  Creatinine 0.61 - 1.24 mg/dL 0.99 0.78  Sodium 135 - 145 mmol/L 136 139  Potassium 3.5 - 5.1 mmol/L 4.6 3.8  Chloride  101 - 111 mmol/L 103 108  CO2 22 - 32 mmol/L 26 26  Calcium 8.9 - 10.3 mg/dL 9.0 9.4  Total Protein 6.5 - 8.1 g/dL 7.0 7.2  Total Bilirubin 0.3 - 1.2 mg/dL 0.4 0.4  Alkaline Phos 38 - 126 U/L 59 52  AST 15 - 41 U/L 16 16  ALT 17 - 63 U/L 16(L) 16(L)   Component     Latest Ref Rng & Units 02/08/2016  Retic Ct Pct     0.4 - 3.1 % 1.3  RBC.     4.22 - 5.81 MIL/uL 4.57  Retic Count, Manual     19.0 - 186.0 K/uL 59.4  LDH     98 - 192 U/L 125  TSH     0.350 - 4.500 uIU/mL 0.934          RADIOGRAPHIC STUDIES: I have personally reviewed the radiological images as listed and agreed with the findings in the report. Study Result   CLINICAL DATA:  Melanoma of left forearm with abnormal PET scan in the mediastinum and hilar regions.  EXAM: CT CHEST WITH CONTRAST  TECHNIQUE: Multidetector CT imaging of the chest was performed during intravenous contrast administration.  CONTRAST:  68mL ISOVUE-300 IOPAMIDOL (ISOVUE-300) INJECTION 61%  COMPARISON:  PET-CT  06/10/2016.  FINDINGS: Cardiovascular: The heart size is normal. No pericardial effusion. Coronary artery calcification is noted. No thoracic aortic aneurysm.  Mediastinum/Nodes: 11 mm short axis AP window lymph node was 9 mm on the recent PET-CT and differential measurement today may be related to a slice collimation. 12 mm short axis right hilar lymph node is identified. 7 mm short axis lymph node is seen in the left hilum. No other evidence for mediastinal or hilar lymphadenopathy although scattered small mediastinal lymph nodes are evident. Patient is status post left axillary lymphadenectomy. No lymphadenopathy in either axilla.  Lungs/Pleura: Subsegmental atelectasis or linear scarring noted right lower lung. No suspicious pulmonary nodule or mass. No focal airspace consolidation. No pulmonary edema or pleural effusion.  Upper Abdomen: Unremarkable.  Musculoskeletal: Bone windows reveal no worrisome lytic or sclerotic osseous lesions.  IMPRESSION: 1. Borderline to slightly enlarged lymph nodes identified in the AP window and right hilum, in the region of hypermetabolism seen on the recent PET-CT. Close follow-up recommended. 2. Coronary artery atherosclerosis.   Electronically Signed   By: Misty Stanley M.D.   On: 06/18/2016 12:33    ASSESSMENT & PLAN:   60 year old Caucasian male with  #1 high-risk stage IIIB malignant melanoma of the left forearm Patient is status post treatment as detailed above with wide local excision of the left forearm lesion. One out of one sentinel lymph node positive for isolated melanoma cells. Completion left axillary lymph node dissection with negative lymph nodes. Patient treated with adjuvant Ipilimumab 10 mg/kg and tolerated only 2 doses after which she developed high-grade fevers and generalized grade 3-leading to discontinuation of his treatment. Required high-dose steroid for about 5-6 weeks. Last PET/CT scan in December  2016 negative for metastatic disease. Current PET/CT reviewed with the patient in detail. CT chest reviewed with the patient, results are noted above. Will repeat imaging in 3 months. Patient is agreeable.   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 -He uses a lymphedema sleeve intermittently and lymphedema comes 3 times a week.  PLAN -Patient has no clinical evidence of disease progression at this time. LDH level within normal limits. -TSH done due to recent anti CTLA therapy - WNL -  Repeat CT chest in 3 months.  -Continue using left upper exudative lymphedema sleeve and lymphedema pumps. Offered to give him a referral to the cancer rehabilitation clinic if needed for continued management of his lymphedema.  06/18/2016 CT Chest reviewed. Results noted above.   RTC post imaging with labs including LDH.   Orders Placed This Encounter  Procedures  . CT Chest W Contrast    Standing Status:   Future    Standing Expiration Date:   06/18/2017    Order Specific Question:   If indicated for the ordered procedure, I authorize the administration of contrast media per Radiology protocol    Answer:   Yes    Order Specific Question:   Reason for Exam (SYMPTOM  OR DIAGNOSIS REQUIRED)    Answer:   follow-up adenopathy, history melanoma    Order Specific Question:   Preferred imaging location?    Answer:   [facility]  . CBC with Differential    Standing Status:   Future    Standing Expiration Date:   06/18/2017  . Comprehensive metabolic panel    Standing Status:   Future    Standing Expiration Date:   06/18/2017  . Lactate dehydrogenase    Standing Status:   Future    Standing Expiration Date:   06/18/2017    All of the patient's questions have been answered and he knows to call the clinic if he has any questions or concerns.    This document serves as a record of services personally performed by Ancil Linsey, MD. It was created on her behalf by  Arlyce Harman, a trained medical scribe. The creation of this record is based on the scribe's personal observations and the provider's statements to them. This document has been checked and approved by the attending provider.  I have reviewed the above documentation for accuracy and completeness and I agree with the above.  This note was electronically signed.  Kelby Fam. Whitney Muse, MD

## 2016-07-20 ENCOUNTER — Encounter (HOSPITAL_COMMUNITY): Payer: Self-pay | Admitting: Hematology & Oncology

## 2016-08-24 ENCOUNTER — Encounter (HOSPITAL_COMMUNITY): Payer: Self-pay | Admitting: Hematology & Oncology

## 2016-09-18 ENCOUNTER — Ambulatory Visit (HOSPITAL_COMMUNITY)
Admission: RE | Admit: 2016-09-18 | Discharge: 2016-09-18 | Disposition: A | Discharge status: Home / Self Care | Payer: 59 | Source: Ambulatory Visit | Attending: Hematology & Oncology | Admitting: Hematology & Oncology

## 2016-09-18 DIAGNOSIS — R590 Localized enlarged lymph nodes: Secondary | ICD-10-CM | POA: Diagnosis present

## 2016-09-18 DIAGNOSIS — I2510 Atherosclerotic heart disease of native coronary artery without angina pectoris: Secondary | ICD-10-CM | POA: Insufficient documentation

## 2016-09-18 DIAGNOSIS — C439 Malignant melanoma of skin, unspecified: Secondary | ICD-10-CM | POA: Diagnosis present

## 2016-09-18 LAB — POCT I-STAT CREATININE: Creatinine, Ser: 0.8 mg/dL (ref 0.61–1.24)

## 2016-09-18 MED ORDER — IOPAMIDOL (ISOVUE-300) INJECTION 61%
75.0000 mL | Freq: Once | INTRAVENOUS | Status: AC | PRN
  Administered 2016-09-18: 75 mL via INTRAVENOUS

## 2016-09-20 ENCOUNTER — Encounter (HOSPITAL_COMMUNITY): Payer: 59

## 2016-09-20 ENCOUNTER — Encounter (HOSPITAL_COMMUNITY): Payer: 59 | Attending: Oncology | Admitting: Oncology

## 2016-09-20 ENCOUNTER — Encounter (HOSPITAL_COMMUNITY): Payer: Self-pay

## 2016-09-20 VITALS — BP 138/82 | HR 72 | Temp 98.4°F | Resp 16 | Wt 224.1 lb

## 2016-09-20 DIAGNOSIS — Z7982 Long term (current) use of aspirin: Secondary | ICD-10-CM | POA: Insufficient documentation

## 2016-09-20 DIAGNOSIS — Z9221 Personal history of antineoplastic chemotherapy: Secondary | ICD-10-CM | POA: Diagnosis not present

## 2016-09-20 DIAGNOSIS — Z7984 Long term (current) use of oral hypoglycemic drugs: Secondary | ICD-10-CM | POA: Diagnosis not present

## 2016-09-20 DIAGNOSIS — C439 Malignant melanoma of skin, unspecified: Secondary | ICD-10-CM

## 2016-09-20 DIAGNOSIS — E785 Hyperlipidemia, unspecified: Secondary | ICD-10-CM | POA: Insufficient documentation

## 2016-09-20 DIAGNOSIS — C779 Secondary and unspecified malignant neoplasm of lymph node, unspecified: Secondary | ICD-10-CM | POA: Diagnosis not present

## 2016-09-20 DIAGNOSIS — E119 Type 2 diabetes mellitus without complications: Secondary | ICD-10-CM | POA: Diagnosis not present

## 2016-09-20 DIAGNOSIS — Z91018 Allergy to other foods: Secondary | ICD-10-CM | POA: Diagnosis not present

## 2016-09-20 DIAGNOSIS — M1990 Unspecified osteoarthritis, unspecified site: Secondary | ICD-10-CM | POA: Diagnosis not present

## 2016-09-20 DIAGNOSIS — Z79899 Other long term (current) drug therapy: Secondary | ICD-10-CM | POA: Insufficient documentation

## 2016-09-20 DIAGNOSIS — I890 Lymphedema, not elsewhere classified: Secondary | ICD-10-CM | POA: Diagnosis not present

## 2016-09-20 DIAGNOSIS — H5462 Unqualified visual loss, left eye, normal vision right eye: Secondary | ICD-10-CM | POA: Diagnosis not present

## 2016-09-20 DIAGNOSIS — R590 Localized enlarged lymph nodes: Secondary | ICD-10-CM | POA: Diagnosis not present

## 2016-09-20 DIAGNOSIS — C4362 Malignant melanoma of left upper limb, including shoulder: Secondary | ICD-10-CM | POA: Diagnosis not present

## 2016-09-20 LAB — COMPREHENSIVE METABOLIC PANEL
ALK PHOS: 48 U/L (ref 38–126)
ALT: 29 U/L (ref 17–63)
AST: 23 U/L (ref 15–41)
Albumin: 3.9 g/dL (ref 3.5–5.0)
Anion gap: 9 (ref 5–15)
BUN: 18 mg/dL (ref 6–20)
CALCIUM: 9 mg/dL (ref 8.9–10.3)
CHLORIDE: 106 mmol/L (ref 101–111)
CO2: 24 mmol/L (ref 22–32)
CREATININE: 0.81 mg/dL (ref 0.61–1.24)
GFR calc Af Amer: >60 mL/min (ref >60)
Glucose, Bld: 204 mg/dL — ABNORMAL HIGH (ref 65–99)
Potassium: 4.2 mmol/L (ref 3.5–5.1)
Sodium: 139 mmol/L (ref 135–145)
Total Bilirubin: 0.6 mg/dL (ref 0.3–1.2)
Total Protein: 6.7 g/dL (ref 6.5–8.1)

## 2016-09-20 LAB — CBC WITH DIFFERENTIAL/PLATELET
BASOS ABS: 0 10*3/uL (ref 0.0–0.1)
Basophils Relative: 1 %
EOS PCT: 4 %
Eosinophils Absolute: 0.3 10*3/uL (ref 0.0–0.7)
HCT: 42.8 % (ref 39.0–52.0)
HEMOGLOBIN: 14.8 g/dL (ref 13.0–17.0)
LYMPHS PCT: 29 %
Lymphs Abs: 1.8 10*3/uL (ref 0.7–4.0)
MCH: 32.7 pg (ref 26.0–34.0)
MCHC: 34.6 g/dL (ref 30.0–36.0)
MCV: 94.7 fL (ref 78.0–100.0)
Monocytes Absolute: 0.7 10*3/uL (ref 0.1–1.0)
Monocytes Relative: 11 %
NEUTROS ABS: 3.5 10*3/uL (ref 1.7–7.7)
NEUTROS PCT: 55 %
PLATELETS: 206 10*3/uL (ref 150–400)
RBC: 4.52 MIL/uL (ref 4.22–5.81)
RDW: 12.9 % (ref 11.5–15.5)
WBC: 6.3 10*3/uL (ref 4.0–10.5)

## 2016-09-20 LAB — LACTATE DEHYDROGENASE: LDH: 146 U/L (ref 98–192)

## 2016-09-20 NOTE — Progress Notes (Signed)
HEMATOLOGY/ONCOLOGY PROGRESS NOTE  Date of Service: 02/08/2016  Patient Care Team: Rory Percy, MD as PCP - General (Family Medicine)  Melanoma surgery: Caryn Section [facility]  CHIEF COMPLAINTS/PURPOSE OF CONSULTATION:  Further evaluation and management of malignant melanoma.   HISTORY OF PRESENTING ILLNESS:   Luis Fitzgerald is a wonderful 61 y.o. 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   He is doing well overall. He denies any new lumps or bumps. He reports continued lymphedema in his left upper extremity however, he has learned how to manage with it and does not have any issues with daily activities. He denies shortness of breath, chest pain, or weakness. He denies abdominal pain.   Recent CT chest on 09/18/2016 shows stable mild mediastinal and right hilar lymphadenopathy. There is no new or progressive findings of metastatic disease in the chest.   ONCOLOGIC HISTORY Melanoma of forearm, left (*)  01/07/2015 Initial Diagnosis  Melanoma of forearm, left (*) status post biopsy  02/08/2015 Surgery  Wide local excision with sentinel lymph node biopsy, SLN with isolated tumor cells in one out of one lymph node .  03/01/2015 Surgery  Completion lymph node dissection from the left axilla consisting of 10 level 1/2 lymph nodes, 2 level II lymph nodes and one level III lymph node neg  03/01/2015 - 03/07/2015 Cancer Staged  MRI scan of the brain negative for metastatic disease, PET/CT scan negative for metastatic disease. PET scan reveals "reactive" high cervical lymph node only, thereby giving him T3b, N1a, M0 (stage III B)  07/19/2015 - Targeted Therapy  Ipilimumab 10 mg/kg, first 07/19/2015 every 2 weeks  Patient received 2 doses of ipilimumab 10 mg/kg as adjuvant therapy [last dose in January 2017] but developed grade 3 toxicity of the skin and high-grade fevers requiring high-dose steroid therapy for  about 5 weeks. He has since been tapered off the steroids.   MEDICAL HISTORY:  Past Medical History:  Diagnosis Date  . Cancer ([place])    melanoma  . Diabetes mellitus without complication (HCC)   -Grade 3 rash due to Ipilimumab -Stage IIIB melanoma with oncologic history as noted above -Diabetes mellitus -Dyslipidemia -Blind in the left eye -Arthritis  SURGICAL HISTORY:  Right hand surgery Left forearm melanoma wide local excision Knee arthroscopy Brain surgery at a 5 or 7yrs to find out if he had an optic nerve tumor   SOCIAL HISTORY: Social History   Social History  . Marital status: Married    Spouse name: N/A  . Number of children: N/A  . Years of education: N/A   Occupational History  . Not on file.   Social History Main Topics  . Smoking status: Never Smoker  . Smokeless tobacco: Never Used  . Alcohol use No  . Drug use: No  . Sexual activity: Yes   Other Topics Concern  . Not on file   Social History Narrative  . No narrative on file  No significant alcohol use Lifelong nonsmoker [place]  FAMILY HISTORY: Does not report any family history of cancers, bleeding disorders or clotting disorders.  ALLERGIES:  is allergic to other and pecan extract allergy skin test. 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 acetaminophen (TYLENOL) 500 MG tablet Take 1,000 mg by mouth.    Marland Kitchen aspirin (GOODSENSE ASPIRIN) 81 MG chewable tablet Chew 81 mg by mouth.    Marland Kitchen  atorvastatin (LIPITOR) 10 MG tablet Take 10 mg by mouth.    . canagliflozin (INVOKANA) 100 MG TABS tablet Take 300 mg by mouth.     . celecoxib (CELEBREX) 200 MG capsule Take 200 mg by mouth.    Marland Kitchen glipiZIDE (GLUCOTROL XL) 10 MG 24 hr tablet Take 10 mg by mouth.    . metFORMIN (GLUCOPHAGE-XR) 500 MG 24 hr tablet Take 2,000 mg by mouth.    . zolpidem (AMBIEN) 10 MG tablet Take 10 mg by mouth.     No current facility-administered  medications for this visit.     REVIEW OF SYSTEMS:   Review of Systems  Constitutional: Negative.   HENT: Negative.   Eyes: Negative.   Respiratory: Negative.   Cardiovascular: Negative.   Gastrointestinal: Negative.   Genitourinary: Negative.   Musculoskeletal: Negative.   Skin: Negative.        Postherpetic neuralgia from shingles  Neurological: Negative.   Endo/Heme/Allergies: Negative.   Psychiatric/Behavioral: Negative.   All other systems reviewed and are negative. 14 point review of systems was performed and is negative except as detailed under history of present illness and above   PHYSICAL EXAMINATION: ECOG PERFORMANCE STATUS: 1 - Symptomatic but completely ambulatory  Vitals:   09/20/16 1003  BP: 138/82  Pulse: 72  Resp: 16  Temp: 98.4 F (36.9 C)   Filed Weights   09/20/16 1003  Weight: 224 lb 1.6 oz (101.7 kg)   .There is no height or weight on file to calculate BMI.    Physical Exam  Constitutional: He is oriented to person, place, and time and well-developed, well-nourished, and in no distress.  Patient was able to get on exam table without assistance.   HENT:  Head: Normocephalic and atraumatic.  Mouth/Throat: Oropharynx is clear and moist.  Eyes: Conjunctivae and EOM are normal. Pupils are equal, round, and reactive to light. No scleral icterus.  Neck: Normal range of motion. Neck supple.  Cardiovascular: Normal rate, regular rhythm and normal heart sounds.   Pulmonary/Chest: Effort normal and breath sounds normal.  Abdominal: Soft. Bowel sounds are normal. He exhibits no distension and no mass. There is no tenderness. There is no rebound and no guarding.  Musculoskeletal: Normal range of motion. He exhibits edema.  Diffuse left arm lymphedema.  Lymphadenopathy:    He has no cervical adenopathy.  Neurological: He is alert and oriented to person, place, and time. Gait normal.  Skin: Skin is warm and dry.  Psychiatric: Mood, affect and judgment  normal.  Nursing note and vitals reviewed.   LABORATORY DATA:  I have reviewed the data as listed  CBC Latest Ref Rng & Units 09/20/2016 06/12/2016 02/08/2016  WBC 4.0 - 10.5 K/uL 6.3 9.1 8.1  Hemoglobin 13.0 - 17.0 g/dL 14.8 15.3 14.9  Hematocrit 39.0 - 52.0 % 42.8 44.2 43.5  Platelets 150 - 400 K/uL 206 264 236    . CMP Latest Ref Rng & Units 09/20/2016 09/18/2016 06/12/2016  Glucose 65 - 99 mg/dL 204(H) - 206(H)  BUN 6 - 20 mg/dL 18 - 17  Creatinine 0.61 - 1.24 mg/dL 0.81 0.80 0.99  Sodium 135 - 145 mmol/L 139 - 136  Potassium 3.5 - 5.1 mmol/L 4.2 - 4.6  Chloride 101 - 111 mmol/L 106 - 103  CO2 22 - 32 mmol/L 24 - 26  Calcium 8.9 - 10.3 mg/dL 9.0 - 9.0  Total Protein 6.5 - 8.1 g/dL 6.7 - 7.0  Total Bilirubin 0.3 - 1.2 mg/dL 0.6 - 0.4  Alkaline Phos 38 - 126 U/L 48 - 59  AST 15 - 41 U/L 23 - 16  ALT 17 - 63 U/L 29 - 16(L)   Component     Latest Ref Rng & Units 02/08/2016  Retic Ct Pct     0.4 - 3.1 % 1.3  RBC.     4.22 - 5.81 MIL/uL 4.57  Retic Count, Manual     19.0 - 186.0 K/uL 59.4  LDH     98 - 192 U/L 125  TSH     0.350 - 4.500 uIU/mL 0.934          RADIOGRAPHIC STUDIES: I have personally reviewed the radiological images as listed and agreed with the findings in the report.  CT chest with contrast 09/18/2016  IMPRESSION: 1. Stable mild mediastinal and right hilar lymphadenopathy, which remains indeterminate for metastatic disease, although the short-term stability off interval therapy is reassuring. Recommend continued chest CT surveillance in 3-6 months. 2. No new or progressive findings of metastatic disease in the chest. 3. Nonspecific low-attenuation superficial subcutaneous 1.3 cm nodule in the midline back at the level of the mid scapula, stable, statistically most likely a sebaceous cyst, although correlation with clinical exam is recommended in this patient with a history of melanoma. 4. Three-vessel coronary atherosclerosis.   ASSESSMENT  & PLAN:   Reviewed recent CT chest with patient. Stable mild mediastinal and right hilar lymphadenopathy. There is no new or progressive findings of metastatic disease in the chest.  Return to clinic in 3 months with CBC CMP LDH and restaging CT chest abdomen pelvis with contrast.    Orders Placed This Encounter  Procedures  . CT Abdomen Pelvis W Contrast    Standing Status:   Future    Standing Expiration Date:   09/20/2017    Order Specific Question:   If indicated for the ordered procedure, I authorize the administration of contrast media per Radiology protocol    Answer:   Yes    Order Specific Question:   Reason for Exam (SYMPTOM  OR DIAGNOSIS REQUIRED)    Answer:   surveillance scan for metastatic melanoma    Order Specific Question:   Preferred imaging location?    Answer:   [facility]  . CT Chest W Contrast    Standing Status:   Future    Standing Expiration Date:   09/20/2017    Order Specific Question:   If indicated for the ordered procedure, I authorize the administration of contrast media per Radiology protocol    Answer:   Yes    Order Specific Question:   Reason for Exam (SYMPTOM  OR DIAGNOSIS REQUIRED)    Answer:   surveillance scan for metastatic melanoma; patient with hilar and mediastinal lymphadenopathy thats being watched    Order Specific Question:   Preferred imaging location?    Answer:   [facility]    All of the patient's questions have been answered and he knows to call the clinic if he has any questions or concerns.    This document serves as a record of services personally performed by Twana First, MD. It was created on her behalf by Arlyce Harman, a trained medical scribe. The creation of this record is based on the scribe's personal observations and the provider's statements to them. This document has been checked and approved by the attending provider.  I have reviewed the above documentation for accuracy and completeness and I agree  with the above.  This note was electronically signed.  Carlis Abbott

## 2016-09-20 NOTE — Patient Instructions (Signed)
[place] at [facility] Discharge Instructions  RECOMMENDATIONS MADE BY THE CONSULTANT AND ANY TEST RESULTS WILL BE SENT TO YOUR REFERRING PHYSICIAN.  You were seen today by Dr. Talbert Cage.  CT scan before your next follow up visit. Return in 3 months for labs and follow up.  Thank you for choosing [facility] to provide your oncology and hematology care.  To afford each patient quality time with our provider, please arrive at least 15 minutes before your scheduled appointment time.    If you have a lab appointment with the [facility] please come in thru the  Main Entrance and check in at the main information desk  You need to re-schedule your appointment should you arrive 10 or more minutes late.  We strive to give you quality time with our providers, and arriving late affects you and other patients whose appointments are after yours.  Also, if you no show three or more times for appointments you may be dismissed from the clinic at the providers discretion.     Again, thank you for choosing [facility].  Our hope is that these requests will decrease the amount of time that you wait before being seen by our physicians.       _____________________________________________________________  Should you have questions after your visit to [facility], please contact our office at [phone] between the hours of 8:30 a.m. and 4:30 p.m.  Voicemails left after 4:30 p.m. will not be returned until the following business day.  For prescription refill requests, have your pharmacy contact our office.       Resources For Cancer Patients and their Caregivers ? American Cancer Society: Can assist with transportation, wigs, general needs, runs Look Good Feel Better.        7860645476 ? Cancer Care: Provides financial assistance, online support groups, medication/co-pay assistance.  1-800-813-HOPE 505-812-2855) ? [place] Assists [place] cancer patients and their families through emotional , educational and financial support.  (763)546-7660 ? [place] DSS Where to apply for food stamps, Medicaid and utility assistance. (757) 464-5330 ? [employer]: Transportation to medical appointments. (743)165-7918 ? Social Security Administration: May apply for disability if have a Stage IV cancer. 531-413-0758 201-072-2061 ? [employer], Disability and Transit Services: Assists with nutrition, care and transit needs. [place] Support Programs: @10RELATIVEDAYS @ > Cancer Support Group  2nd Tuesday of the month 1pm-2pm, Journey Room  > Creative Journey  3rd Tuesday of the month 1130am-1pm, Journey Room  > Look Good Feel Better  1st Wednesday of the month 10am-12 noon, Journey Room (Call [place] to register [phone])

## 2016-12-02 IMAGING — CT CT CHEST W/ CM
2 of 6 series · 15 of 36 positions shown, 19 images · IV contrast (iopamidol)
Comparison: PET-CT 06/10/2016.

CLINICAL DATA: Melanoma of left forearm with abnormal PET scan in
the mediastinum and hilar regions.

EXAM:
CT CHEST WITH CONTRAST
TECHNIQUE: Multidetector CT imaging of the chest was performed during
intravenous contrast administration.
CONTRAST:  75mL 7R47CV-HYY IOPAMIDOL (7R47CV-HYY) INJECTION 61%

[Series 2: axial st · axial · 0.71mm/px · z∈[-36,+256]mm · 14 of 160 slices shown, 18 images]
[im 7/160  mediastinal]
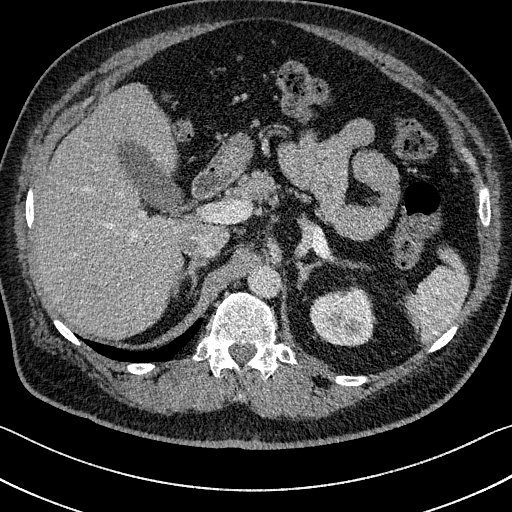
[im 7/160  lung]
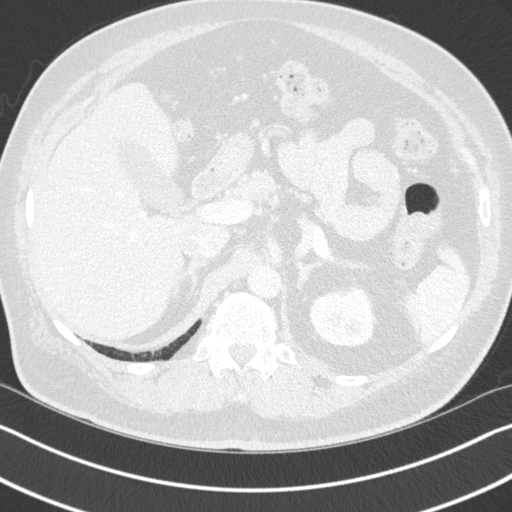
[im 20/160  lung]
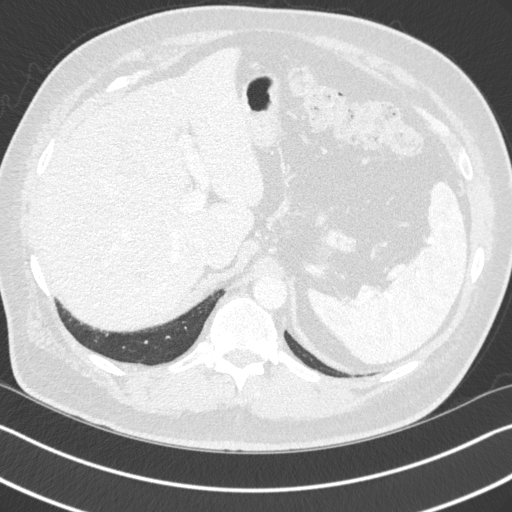
[im 34/160  lung]
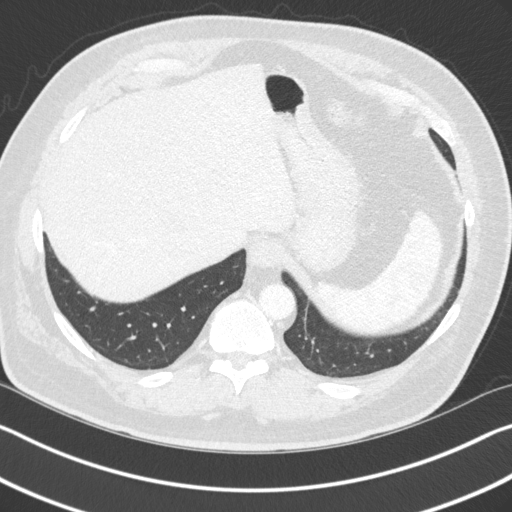
[im 40/160  lung]
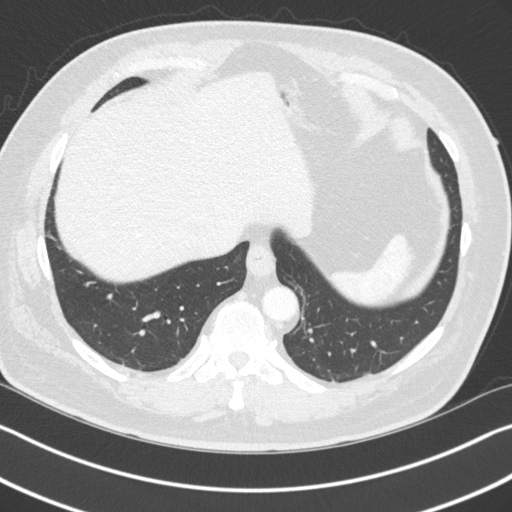
[im 54/160  mediastinal]
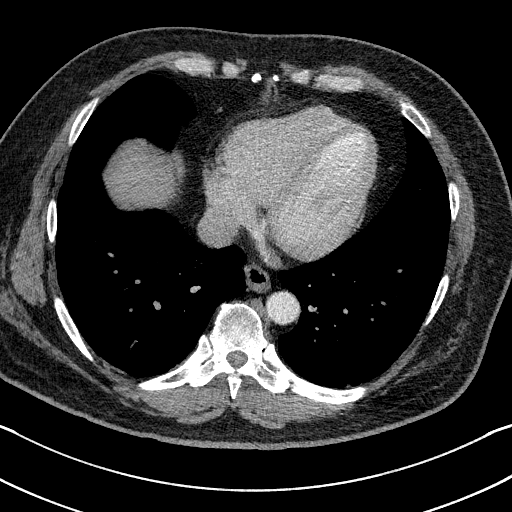
[im 54/160  lung]
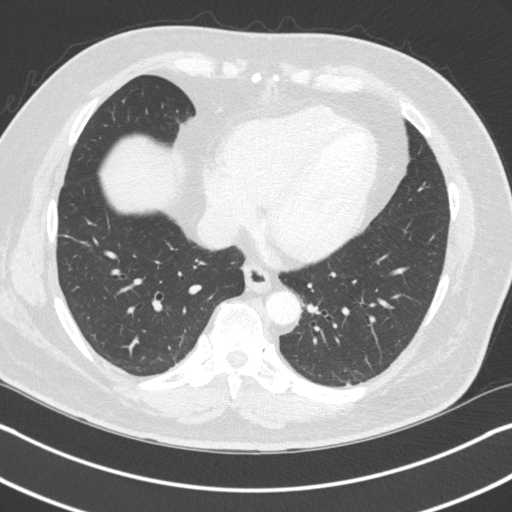
[im 67/160  lung]
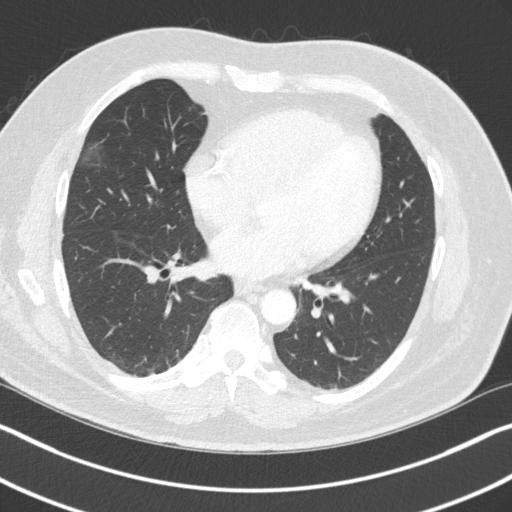
[im 73/160  lung]
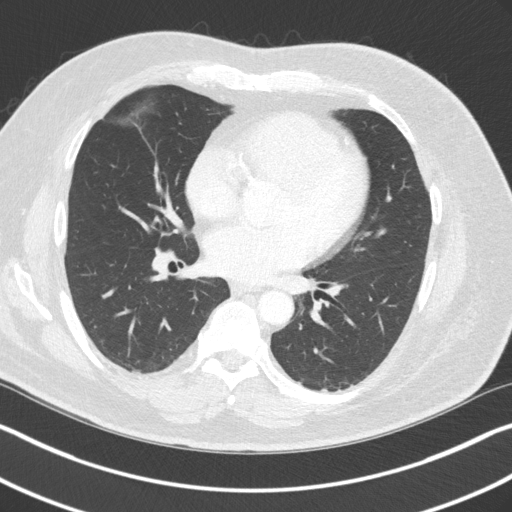
[im 87/160  lung]
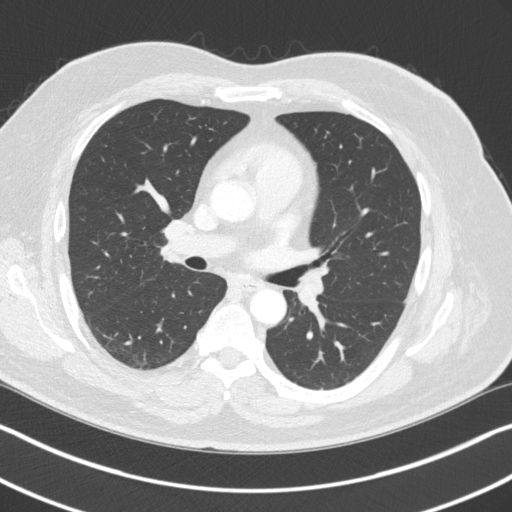
[im 93/160  mediastinal]
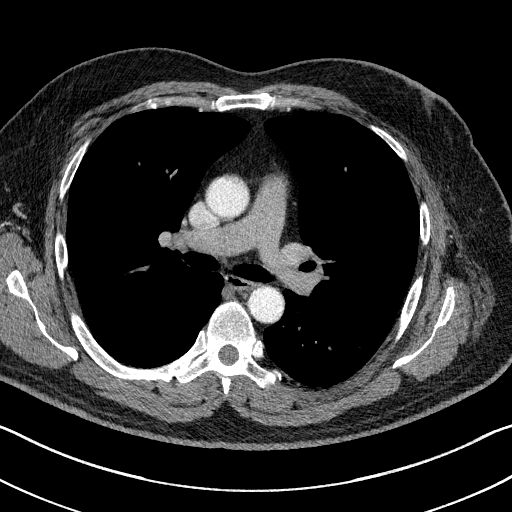
[im 93/160  lung]
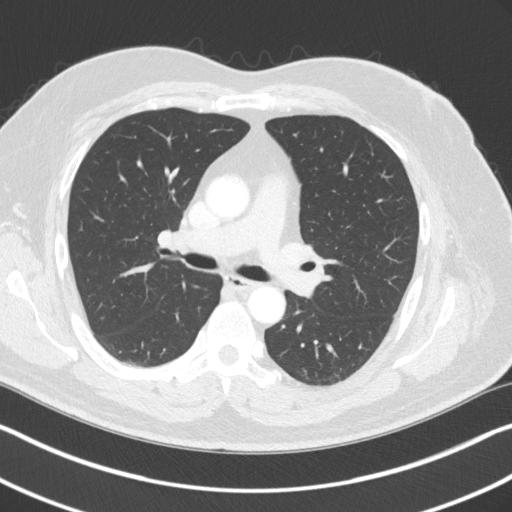
[im 107/160  lung]
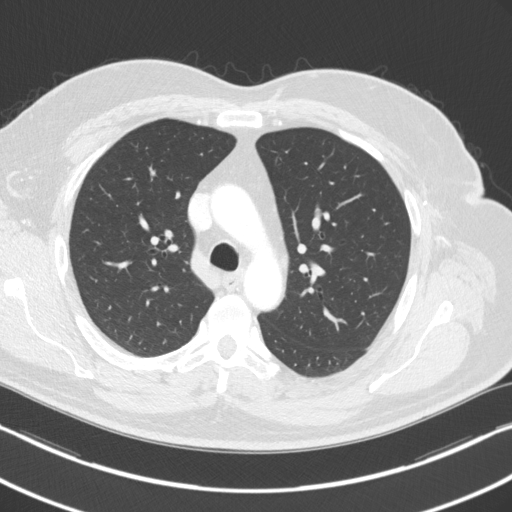
[im 120/160  lung]
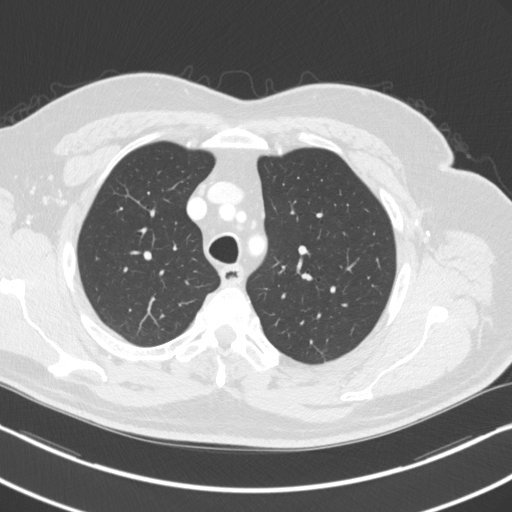
[im 126/160  lung]
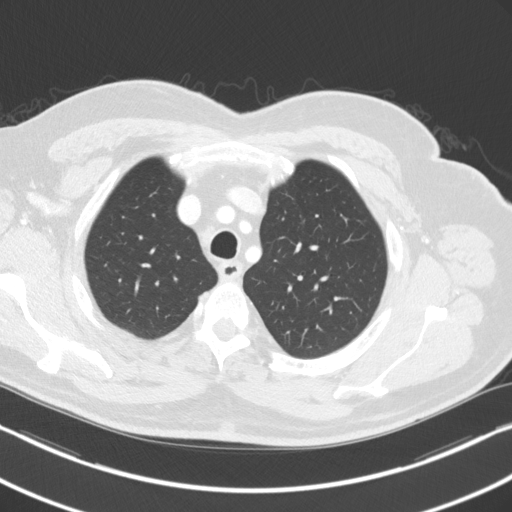
[im 140/160  mediastinal]
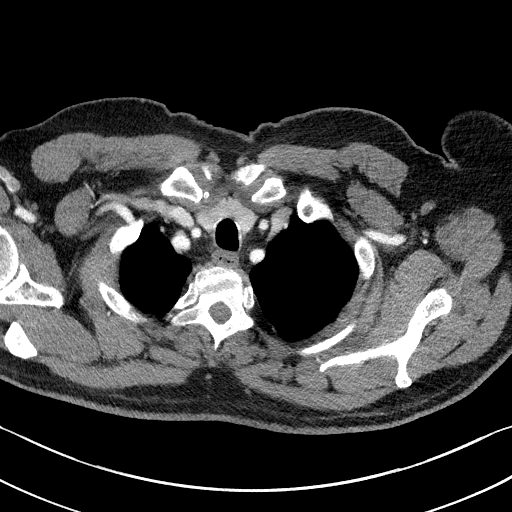
[im 140/160  lung]
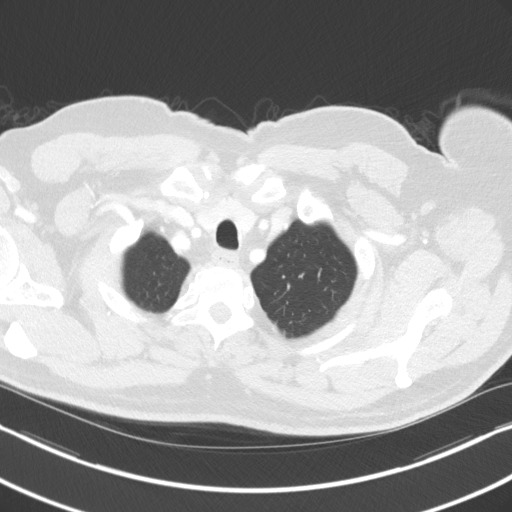
[im 153/160  lung]
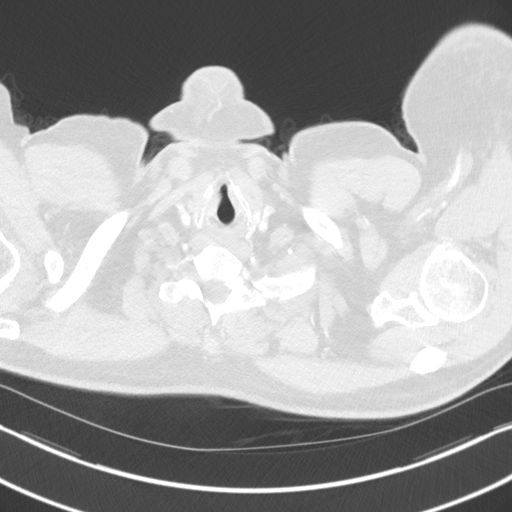

[Series 5: coronal · coronal · 0.66mm/px · 1 of 175 slices shown]
[im 88/175  lung]
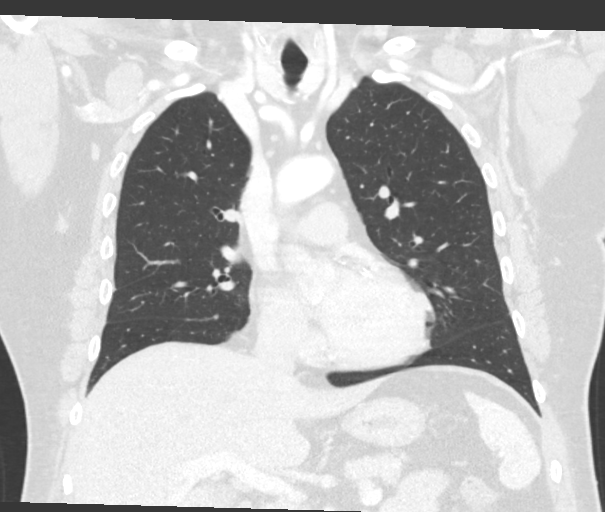

[15 of 36 positions shown; findings below may reference images not displayed]

FINDINGS: Cardiovascular: The heart size is normal. No pericardial effusion.
Coronary artery calcification is noted. No thoracic aortic aneurysm.

Mediastinum/Nodes: 11 mm short axis AP window lymph node was 9 mm on
the recent PET-CT and differential measurement today may be related
to a slice collimation. 12 mm short axis right hilar lymph node is
identified. 7 mm short axis lymph node is seen in the left hilum. No
other evidence for mediastinal or hilar lymphadenopathy although
scattered small mediastinal lymph nodes are evident. Patient is
status post left axillary lymphadenectomy. No lymphadenopathy in
either axilla.

Lungs/Pleura: Subsegmental atelectasis or linear scarring noted
right lower lung. No suspicious pulmonary nodule or mass. No focal
airspace consolidation. No pulmonary edema or pleural effusion.

Upper Abdomen: Unremarkable.

Musculoskeletal: Bone windows reveal no worrisome lytic or sclerotic
osseous lesions.
IMPRESSION: 1. Borderline to slightly enlarged lymph nodes identified in the AP
window and right hilum, in the region of hypermetabolism seen on the
recent PET-CT. Close follow-up recommended.
2. Coronary artery atherosclerosis.

## 2016-12-13 ENCOUNTER — Other Ambulatory Visit (HOSPITAL_COMMUNITY): Payer: 59

## 2016-12-18 ENCOUNTER — Ambulatory Visit (HOSPITAL_COMMUNITY): Payer: 59

## 2016-12-20 ENCOUNTER — Ambulatory Visit (HOSPITAL_COMMUNITY): Payer: 59

## 2017-03-04 IMAGING — CT CT CHEST W/ CM
2 of 3 series · 15 of 36 positions shown, 18 images · IV contrast (iopamidol)
Comparison: 06/18/2016 chest CT.

CLINICAL DATA: History of malignant melanoma of the left forearm.
Re- stage mediastinal and right hilar lymphadenopathy, which was
hypermetabolic on prior PET-CT.

EXAM:
CT CHEST WITH CONTRAST
TECHNIQUE: Multidetector CT imaging of the chest was performed during
intravenous contrast administration.
CONTRAST:  75mL [id number] IOPAMIDOL (I3S28Y-3II) INJECTION 61%

[Series 2: axial st · axial · 0.73mm/px · z∈[+1216,+1514]mm · 12 of 177 slices shown, 15 images]
[im 14/177  mediastinal]
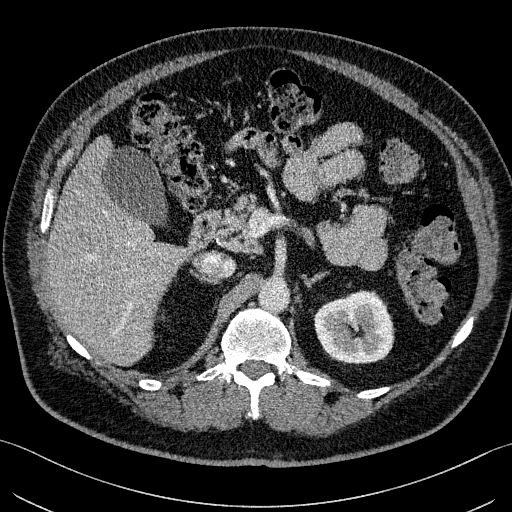
[im 14/177  lung]
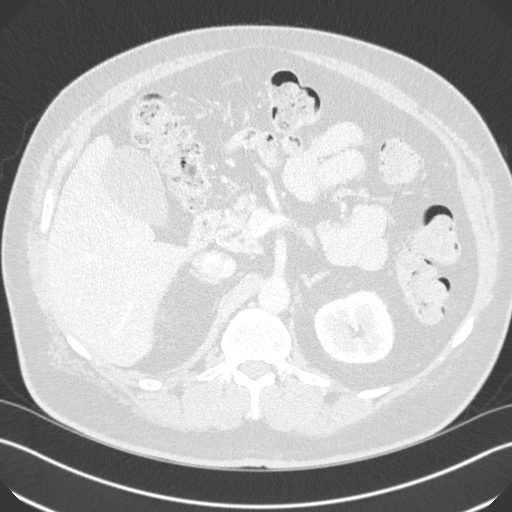
[im 27/177  lung]
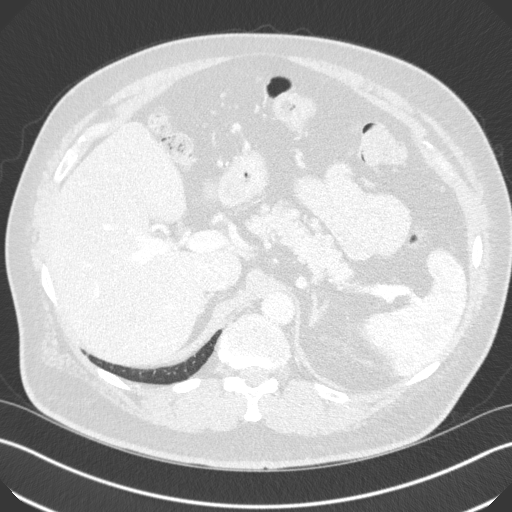
[im 40/177  lung]
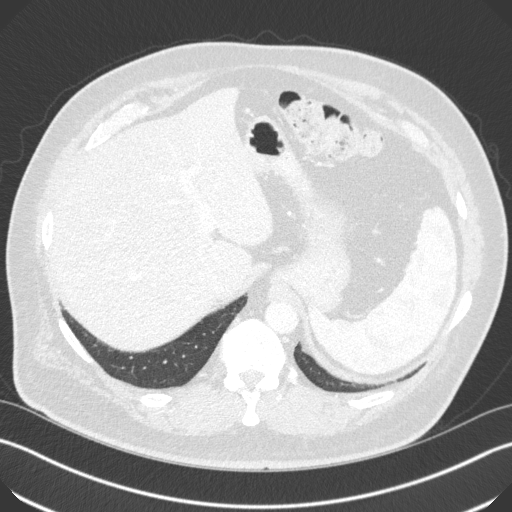
[im 53/177  lung]
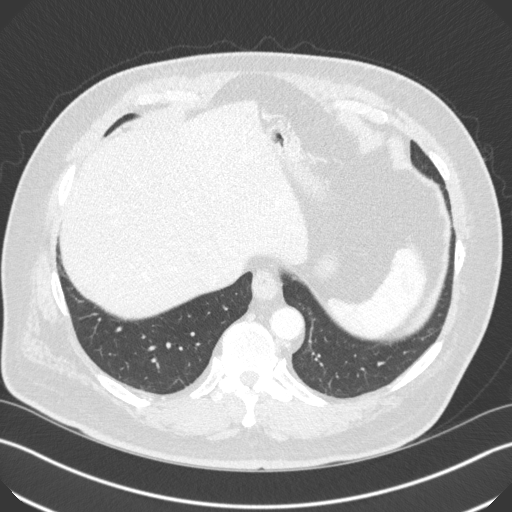
[im 66/177  mediastinal]
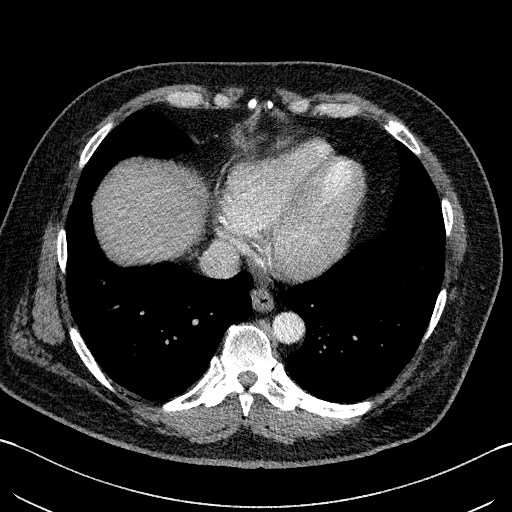
[im 66/177  lung]
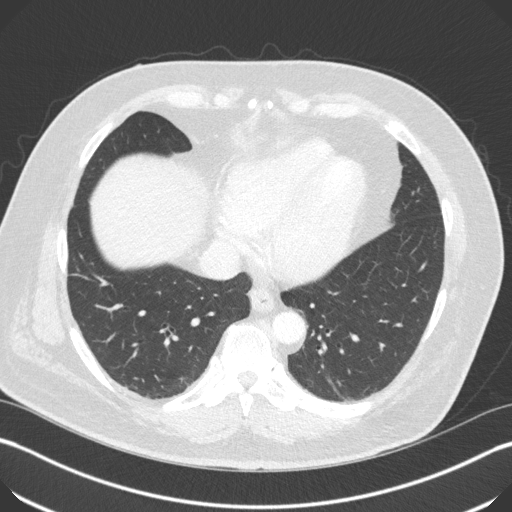
[im 79/177  lung]
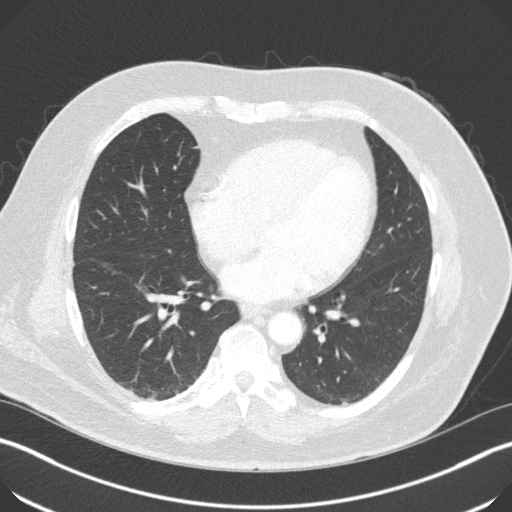
[im 98/177  lung]
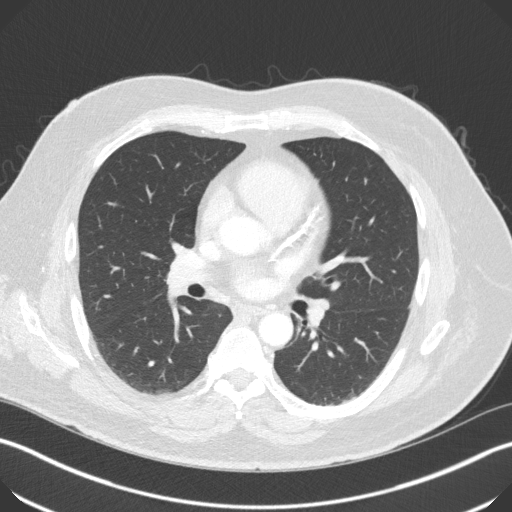
[im 111/177  lung]
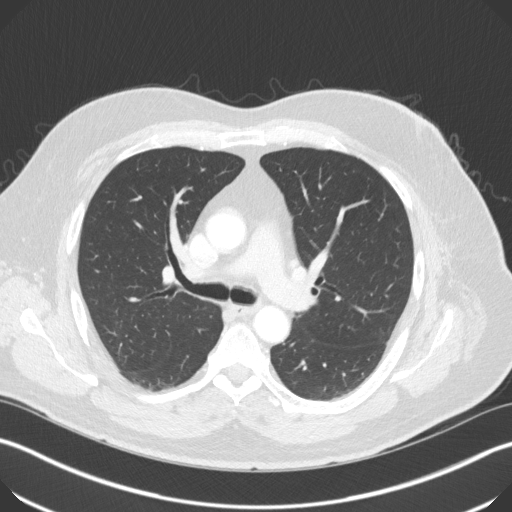
[im 124/177  mediastinal]
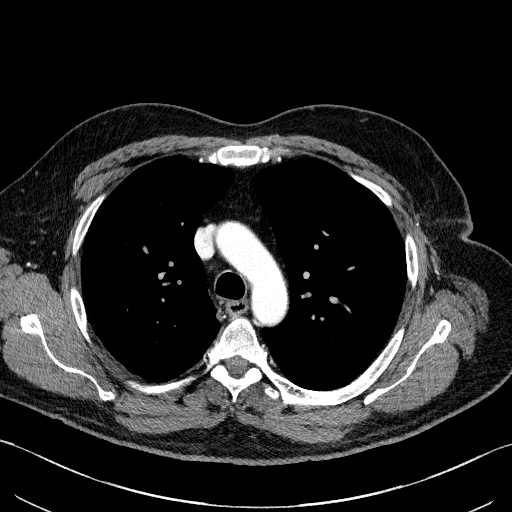
[im 124/177  lung]
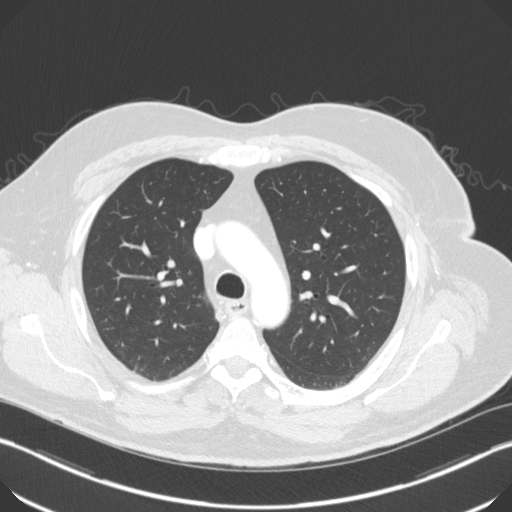
[im 137/177  lung]
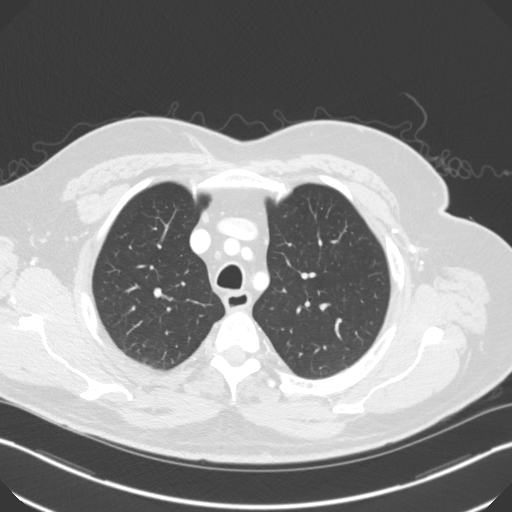
[im 150/177  lung]
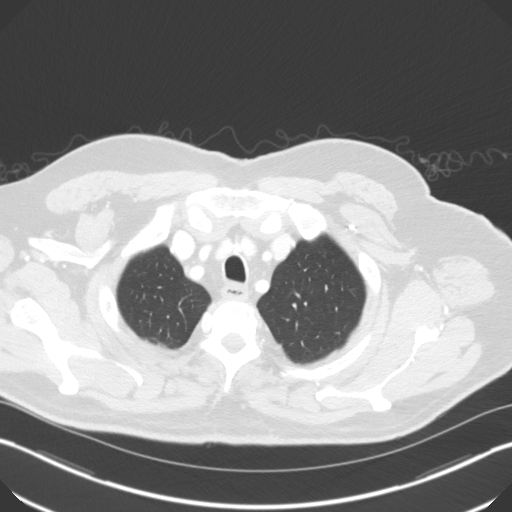
[im 163/177  lung]
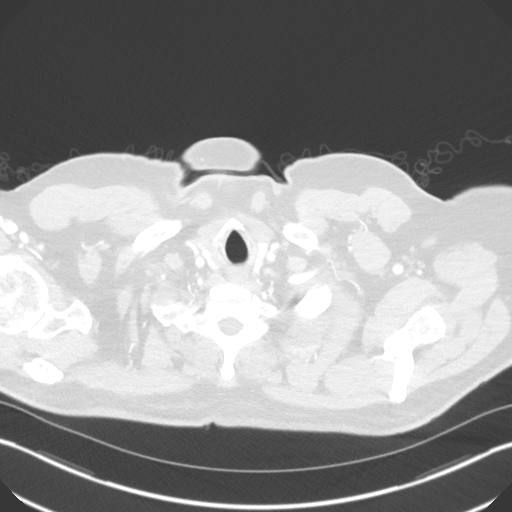

[Series 5: coronal · coronal · 0.75mm/px · 3 of 156 slices shown]
[im 32/156  lung]
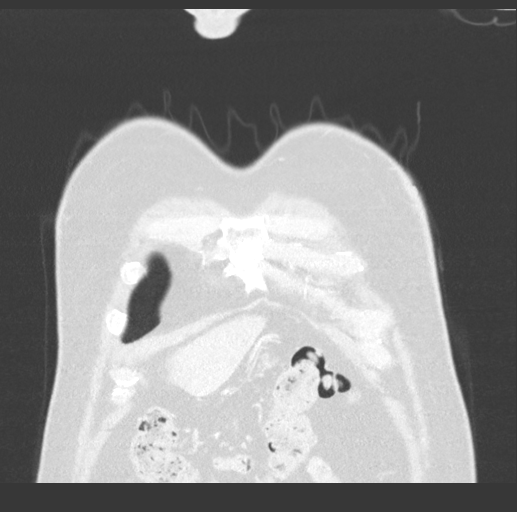
[im 63/156  lung]
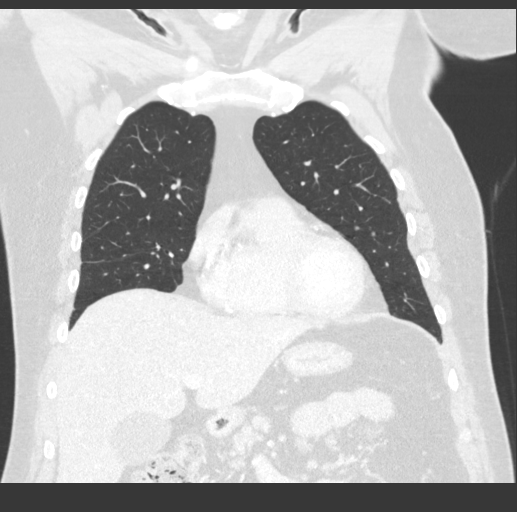
[im 94/156  lung]
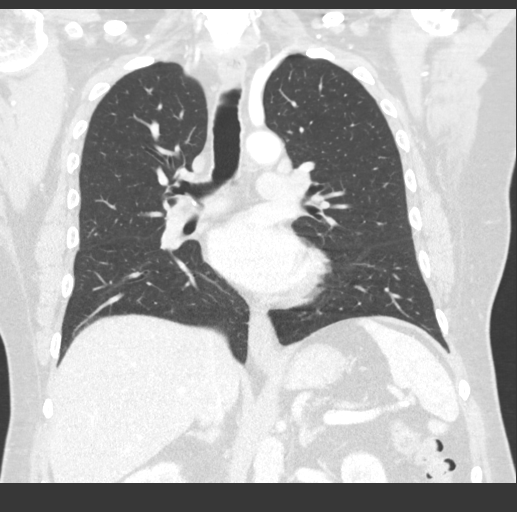

[15 of 36 positions shown; findings below may reference images not displayed]

FINDINGS: Cardiovascular: Normal heart size. No significant pericardial
fluid/thickening. Left anterior descending, left circumflex and
right coronary atherosclerosis. Great vessels are normal in course
and caliber. No central pulmonary emboli.

Mediastinum/Nodes: No discrete thyroid nodules. Unremarkable
esophagus. Surgical clips are again noted in the left axilla. No
axillary adenopathy. Mildly enlarged 1.1 cm right hilar lymph node
(series 2/ image 71), previously 1.2 cm, not appreciably changed.
Mildly enlarged 1.0 cm AP window node (series 2/image 60),
previously 1.1 cm, not appreciably changed. No additional
pathologically enlarged mediastinal or hilar lymph nodes.

Lungs/Pleura: No pneumothorax. No pleural effusion. No acute
consolidative airspace disease, lung masses or significant pulmonary
nodules.

Upper abdomen: Unremarkable.

Musculoskeletal: No aggressive appearing focal osseous lesions.
Minimal thoracic spondylosis. Low-attenuation superficial
subcutaneous 1.3 cm nodule in the midline back at the level of the
mid scapula (series 2/ image 36), stable.
IMPRESSION: 1. Stable mild mediastinal and right hilar lymphadenopathy, which
remains indeterminate for metastatic disease, although the
short-term stability off interval therapy is reassuring. Recommend
continued chest CT surveillance in 3-6 months.
2. No new or progressive findings of metastatic disease in the
chest.
3. Nonspecific low-attenuation superficial subcutaneous 1.3 cm
nodule in the midline back at the level of the mid scapula, stable,
statistically most likely a sebaceous cyst, although correlation
with clinical exam is recommended in this patient with a history of
melanoma.
4. Three-vessel coronary atherosclerosis.

## 2019-03-01 DIAGNOSIS — Z85820 Personal history of malignant melanoma of skin: Secondary | ICD-10-CM | POA: Diagnosis not present

## 2019-03-01 DIAGNOSIS — E1165 Type 2 diabetes mellitus with hyperglycemia: Secondary | ICD-10-CM | POA: Diagnosis not present

## 2019-03-01 DIAGNOSIS — E782 Mixed hyperlipidemia: Secondary | ICD-10-CM | POA: Diagnosis not present

## 2019-03-01 DIAGNOSIS — I10 Essential (primary) hypertension: Secondary | ICD-10-CM | POA: Diagnosis not present

## 2019-03-29 DIAGNOSIS — C44219 Basal cell carcinoma of skin of left ear and external auricular canal: Secondary | ICD-10-CM | POA: Diagnosis not present

## 2019-03-29 DIAGNOSIS — Z08 Encounter for follow-up examination after completed treatment for malignant neoplasm: Secondary | ICD-10-CM | POA: Diagnosis not present

## 2019-03-29 DIAGNOSIS — Z85820 Personal history of malignant melanoma of skin: Secondary | ICD-10-CM | POA: Diagnosis not present

## 2019-03-29 DIAGNOSIS — D225 Melanocytic nevi of trunk: Secondary | ICD-10-CM | POA: Diagnosis not present

## 2019-03-29 DIAGNOSIS — Z1283 Encounter for screening for malignant neoplasm of skin: Secondary | ICD-10-CM | POA: Diagnosis not present

## 2019-05-24 DIAGNOSIS — Z85828 Personal history of other malignant neoplasm of skin: Secondary | ICD-10-CM | POA: Diagnosis not present

## 2019-05-24 DIAGNOSIS — Z08 Encounter for follow-up examination after completed treatment for malignant neoplasm: Secondary | ICD-10-CM | POA: Diagnosis not present

## 2019-05-24 DIAGNOSIS — L820 Inflamed seborrheic keratosis: Secondary | ICD-10-CM | POA: Diagnosis not present

## 2019-05-24 DIAGNOSIS — Z85820 Personal history of malignant melanoma of skin: Secondary | ICD-10-CM | POA: Diagnosis not present

## 2019-07-05 DIAGNOSIS — Z85820 Personal history of malignant melanoma of skin: Secondary | ICD-10-CM | POA: Diagnosis not present

## 2019-07-05 DIAGNOSIS — E1165 Type 2 diabetes mellitus with hyperglycemia: Secondary | ICD-10-CM | POA: Diagnosis not present

## 2019-07-05 DIAGNOSIS — E782 Mixed hyperlipidemia: Secondary | ICD-10-CM | POA: Diagnosis not present

## 2019-07-05 DIAGNOSIS — I10 Essential (primary) hypertension: Secondary | ICD-10-CM | POA: Diagnosis not present

## 2019-07-29 DIAGNOSIS — E782 Mixed hyperlipidemia: Secondary | ICD-10-CM | POA: Diagnosis not present

## 2019-07-29 DIAGNOSIS — I10 Essential (primary) hypertension: Secondary | ICD-10-CM | POA: Diagnosis not present

## 2019-08-27 DIAGNOSIS — I10 Essential (primary) hypertension: Secondary | ICD-10-CM | POA: Diagnosis not present

## 2019-08-27 DIAGNOSIS — E7849 Other hyperlipidemia: Secondary | ICD-10-CM | POA: Diagnosis not present

## 2019-08-27 DIAGNOSIS — E1165 Type 2 diabetes mellitus with hyperglycemia: Secondary | ICD-10-CM | POA: Diagnosis not present

## 2019-09-23 DIAGNOSIS — Z08 Encounter for follow-up examination after completed treatment for malignant neoplasm: Secondary | ICD-10-CM | POA: Diagnosis not present

## 2019-09-23 DIAGNOSIS — Z1283 Encounter for screening for malignant neoplasm of skin: Secondary | ICD-10-CM | POA: Diagnosis not present

## 2019-09-23 DIAGNOSIS — D225 Melanocytic nevi of trunk: Secondary | ICD-10-CM | POA: Diagnosis not present

## 2019-09-23 DIAGNOSIS — Z85820 Personal history of malignant melanoma of skin: Secondary | ICD-10-CM | POA: Diagnosis not present

## 2019-10-27 DIAGNOSIS — E782 Mixed hyperlipidemia: Secondary | ICD-10-CM | POA: Diagnosis not present

## 2019-10-27 DIAGNOSIS — I10 Essential (primary) hypertension: Secondary | ICD-10-CM | POA: Diagnosis not present

## 2019-10-27 DIAGNOSIS — E1140 Type 2 diabetes mellitus with diabetic neuropathy, unspecified: Secondary | ICD-10-CM | POA: Diagnosis not present

## 2019-11-29 DIAGNOSIS — Z6834 Body mass index (BMI) 34.0-34.9, adult: Secondary | ICD-10-CM | POA: Diagnosis not present

## 2019-11-29 DIAGNOSIS — L02212 Cutaneous abscess of back [any part, except buttock]: Secondary | ICD-10-CM | POA: Diagnosis not present

## 2020-02-07 DIAGNOSIS — E782 Mixed hyperlipidemia: Secondary | ICD-10-CM | POA: Diagnosis not present

## 2020-02-07 DIAGNOSIS — E7800 Pure hypercholesterolemia, unspecified: Secondary | ICD-10-CM | POA: Diagnosis not present

## 2020-02-07 DIAGNOSIS — E1165 Type 2 diabetes mellitus with hyperglycemia: Secondary | ICD-10-CM | POA: Diagnosis not present

## 2020-02-07 DIAGNOSIS — E1140 Type 2 diabetes mellitus with diabetic neuropathy, unspecified: Secondary | ICD-10-CM | POA: Diagnosis not present

## 2020-02-09 DIAGNOSIS — E782 Mixed hyperlipidemia: Secondary | ICD-10-CM | POA: Diagnosis not present

## 2020-02-09 DIAGNOSIS — E1165 Type 2 diabetes mellitus with hyperglycemia: Secondary | ICD-10-CM | POA: Diagnosis not present

## 2020-02-09 DIAGNOSIS — I10 Essential (primary) hypertension: Secondary | ICD-10-CM | POA: Diagnosis not present

## 2020-02-09 DIAGNOSIS — Z0001 Encounter for general adult medical examination with abnormal findings: Secondary | ICD-10-CM | POA: Diagnosis not present

## 2020-02-25 DIAGNOSIS — E1165 Type 2 diabetes mellitus with hyperglycemia: Secondary | ICD-10-CM | POA: Diagnosis not present

## 2020-02-25 DIAGNOSIS — Z7984 Long term (current) use of oral hypoglycemic drugs: Secondary | ICD-10-CM | POA: Diagnosis not present

## 2020-02-25 DIAGNOSIS — E7849 Other hyperlipidemia: Secondary | ICD-10-CM | POA: Diagnosis not present

## 2020-02-25 DIAGNOSIS — I10 Essential (primary) hypertension: Secondary | ICD-10-CM | POA: Diagnosis not present

## 2020-03-23 DIAGNOSIS — Z1283 Encounter for screening for malignant neoplasm of skin: Secondary | ICD-10-CM | POA: Diagnosis not present

## 2020-03-23 DIAGNOSIS — Z08 Encounter for follow-up examination after completed treatment for malignant neoplasm: Secondary | ICD-10-CM | POA: Diagnosis not present

## 2020-03-23 DIAGNOSIS — Z85820 Personal history of malignant melanoma of skin: Secondary | ICD-10-CM | POA: Diagnosis not present

## 2020-03-23 DIAGNOSIS — D225 Melanocytic nevi of trunk: Secondary | ICD-10-CM | POA: Diagnosis not present

## 2020-03-23 DIAGNOSIS — L820 Inflamed seborrheic keratosis: Secondary | ICD-10-CM | POA: Diagnosis not present

## 2020-04-18 DIAGNOSIS — M7989 Other specified soft tissue disorders: Secondary | ICD-10-CM | POA: Diagnosis not present

## 2020-04-19 DIAGNOSIS — M79602 Pain in left arm: Secondary | ICD-10-CM | POA: Diagnosis not present

## 2020-04-19 DIAGNOSIS — M7989 Other specified soft tissue disorders: Secondary | ICD-10-CM | POA: Diagnosis not present

## 2020-06-07 DIAGNOSIS — M546 Pain in thoracic spine: Secondary | ICD-10-CM | POA: Diagnosis not present

## 2020-06-07 DIAGNOSIS — M9902 Segmental and somatic dysfunction of thoracic region: Secondary | ICD-10-CM | POA: Diagnosis not present

## 2020-06-07 DIAGNOSIS — M62830 Muscle spasm of back: Secondary | ICD-10-CM | POA: Diagnosis not present

## 2020-06-07 DIAGNOSIS — M9903 Segmental and somatic dysfunction of lumbar region: Secondary | ICD-10-CM | POA: Diagnosis not present

## 2020-06-09 DIAGNOSIS — M9902 Segmental and somatic dysfunction of thoracic region: Secondary | ICD-10-CM | POA: Diagnosis not present

## 2020-06-09 DIAGNOSIS — E782 Mixed hyperlipidemia: Secondary | ICD-10-CM | POA: Diagnosis not present

## 2020-06-09 DIAGNOSIS — M546 Pain in thoracic spine: Secondary | ICD-10-CM | POA: Diagnosis not present

## 2020-06-09 DIAGNOSIS — E1140 Type 2 diabetes mellitus with diabetic neuropathy, unspecified: Secondary | ICD-10-CM | POA: Diagnosis not present

## 2020-06-09 DIAGNOSIS — I10 Essential (primary) hypertension: Secondary | ICD-10-CM | POA: Diagnosis not present

## 2020-06-09 DIAGNOSIS — M9903 Segmental and somatic dysfunction of lumbar region: Secondary | ICD-10-CM | POA: Diagnosis not present

## 2020-06-09 DIAGNOSIS — M62830 Muscle spasm of back: Secondary | ICD-10-CM | POA: Diagnosis not present

## 2020-06-14 DIAGNOSIS — M546 Pain in thoracic spine: Secondary | ICD-10-CM | POA: Diagnosis not present

## 2020-06-14 DIAGNOSIS — M9902 Segmental and somatic dysfunction of thoracic region: Secondary | ICD-10-CM | POA: Diagnosis not present

## 2020-06-14 DIAGNOSIS — M62830 Muscle spasm of back: Secondary | ICD-10-CM | POA: Diagnosis not present

## 2020-06-14 DIAGNOSIS — M9903 Segmental and somatic dysfunction of lumbar region: Secondary | ICD-10-CM | POA: Diagnosis not present

## 2020-06-15 DIAGNOSIS — E1165 Type 2 diabetes mellitus with hyperglycemia: Secondary | ICD-10-CM | POA: Diagnosis not present

## 2020-06-15 DIAGNOSIS — I10 Essential (primary) hypertension: Secondary | ICD-10-CM | POA: Diagnosis not present

## 2020-06-15 DIAGNOSIS — E782 Mixed hyperlipidemia: Secondary | ICD-10-CM | POA: Diagnosis not present

## 2020-06-15 DIAGNOSIS — N138 Other obstructive and reflux uropathy: Secondary | ICD-10-CM | POA: Diagnosis not present

## 2020-07-12 DIAGNOSIS — M62830 Muscle spasm of back: Secondary | ICD-10-CM | POA: Diagnosis not present

## 2020-07-12 DIAGNOSIS — M9902 Segmental and somatic dysfunction of thoracic region: Secondary | ICD-10-CM | POA: Diagnosis not present

## 2020-07-12 DIAGNOSIS — M9903 Segmental and somatic dysfunction of lumbar region: Secondary | ICD-10-CM | POA: Diagnosis not present

## 2020-07-12 DIAGNOSIS — M546 Pain in thoracic spine: Secondary | ICD-10-CM | POA: Diagnosis not present

## 2020-08-16 DIAGNOSIS — M9903 Segmental and somatic dysfunction of lumbar region: Secondary | ICD-10-CM | POA: Diagnosis not present

## 2020-08-16 DIAGNOSIS — M546 Pain in thoracic spine: Secondary | ICD-10-CM | POA: Diagnosis not present

## 2020-08-16 DIAGNOSIS — M62830 Muscle spasm of back: Secondary | ICD-10-CM | POA: Diagnosis not present

## 2020-08-16 DIAGNOSIS — M9902 Segmental and somatic dysfunction of thoracic region: Secondary | ICD-10-CM | POA: Diagnosis not present

## 2020-08-28 DIAGNOSIS — B372 Candidiasis of skin and nail: Secondary | ICD-10-CM | POA: Diagnosis not present

## 2020-08-28 DIAGNOSIS — Z6834 Body mass index (BMI) 34.0-34.9, adult: Secondary | ICD-10-CM | POA: Diagnosis not present

## 2020-09-13 DIAGNOSIS — M62830 Muscle spasm of back: Secondary | ICD-10-CM | POA: Diagnosis not present

## 2020-09-13 DIAGNOSIS — M9902 Segmental and somatic dysfunction of thoracic region: Secondary | ICD-10-CM | POA: Diagnosis not present

## 2020-09-13 DIAGNOSIS — M546 Pain in thoracic spine: Secondary | ICD-10-CM | POA: Diagnosis not present

## 2020-09-13 DIAGNOSIS — M9903 Segmental and somatic dysfunction of lumbar region: Secondary | ICD-10-CM | POA: Diagnosis not present

## 2020-10-09 DIAGNOSIS — M9903 Segmental and somatic dysfunction of lumbar region: Secondary | ICD-10-CM | POA: Diagnosis not present

## 2020-10-09 DIAGNOSIS — M9902 Segmental and somatic dysfunction of thoracic region: Secondary | ICD-10-CM | POA: Diagnosis not present

## 2020-10-09 DIAGNOSIS — M546 Pain in thoracic spine: Secondary | ICD-10-CM | POA: Diagnosis not present

## 2020-10-09 DIAGNOSIS — M62830 Muscle spasm of back: Secondary | ICD-10-CM | POA: Diagnosis not present

## 2020-10-13 DIAGNOSIS — I10 Essential (primary) hypertension: Secondary | ICD-10-CM | POA: Diagnosis not present

## 2020-10-13 DIAGNOSIS — E1140 Type 2 diabetes mellitus with diabetic neuropathy, unspecified: Secondary | ICD-10-CM | POA: Diagnosis not present

## 2020-10-13 DIAGNOSIS — E782 Mixed hyperlipidemia: Secondary | ICD-10-CM | POA: Diagnosis not present

## 2020-10-16 DIAGNOSIS — E7849 Other hyperlipidemia: Secondary | ICD-10-CM | POA: Diagnosis not present

## 2020-10-16 DIAGNOSIS — N138 Other obstructive and reflux uropathy: Secondary | ICD-10-CM | POA: Diagnosis not present

## 2020-10-16 DIAGNOSIS — E1165 Type 2 diabetes mellitus with hyperglycemia: Secondary | ICD-10-CM | POA: Diagnosis not present

## 2020-10-16 DIAGNOSIS — I10 Essential (primary) hypertension: Secondary | ICD-10-CM | POA: Diagnosis not present

## 2020-11-22 DIAGNOSIS — M9903 Segmental and somatic dysfunction of lumbar region: Secondary | ICD-10-CM | POA: Diagnosis not present

## 2020-11-22 DIAGNOSIS — M62830 Muscle spasm of back: Secondary | ICD-10-CM | POA: Diagnosis not present

## 2020-11-22 DIAGNOSIS — M546 Pain in thoracic spine: Secondary | ICD-10-CM | POA: Diagnosis not present

## 2020-11-22 DIAGNOSIS — M9902 Segmental and somatic dysfunction of thoracic region: Secondary | ICD-10-CM | POA: Diagnosis not present

## 2020-12-20 DIAGNOSIS — M9902 Segmental and somatic dysfunction of thoracic region: Secondary | ICD-10-CM | POA: Diagnosis not present

## 2020-12-20 DIAGNOSIS — M9903 Segmental and somatic dysfunction of lumbar region: Secondary | ICD-10-CM | POA: Diagnosis not present

## 2020-12-20 DIAGNOSIS — M546 Pain in thoracic spine: Secondary | ICD-10-CM | POA: Diagnosis not present

## 2020-12-20 DIAGNOSIS — M62830 Muscle spasm of back: Secondary | ICD-10-CM | POA: Diagnosis not present

## 2021-01-17 DIAGNOSIS — M9902 Segmental and somatic dysfunction of thoracic region: Secondary | ICD-10-CM | POA: Diagnosis not present

## 2021-01-17 DIAGNOSIS — M9903 Segmental and somatic dysfunction of lumbar region: Secondary | ICD-10-CM | POA: Diagnosis not present

## 2021-01-17 DIAGNOSIS — M62830 Muscle spasm of back: Secondary | ICD-10-CM | POA: Diagnosis not present

## 2021-01-17 DIAGNOSIS — M546 Pain in thoracic spine: Secondary | ICD-10-CM | POA: Diagnosis not present

## 2021-02-09 DIAGNOSIS — E7801 Familial hypercholesterolemia: Secondary | ICD-10-CM | POA: Diagnosis not present

## 2021-02-09 DIAGNOSIS — E1165 Type 2 diabetes mellitus with hyperglycemia: Secondary | ICD-10-CM | POA: Diagnosis not present

## 2021-02-09 DIAGNOSIS — E1140 Type 2 diabetes mellitus with diabetic neuropathy, unspecified: Secondary | ICD-10-CM | POA: Diagnosis not present

## 2021-02-09 DIAGNOSIS — I10 Essential (primary) hypertension: Secondary | ICD-10-CM | POA: Diagnosis not present

## 2021-02-09 DIAGNOSIS — E7800 Pure hypercholesterolemia, unspecified: Secondary | ICD-10-CM | POA: Diagnosis not present

## 2021-02-13 DIAGNOSIS — I10 Essential (primary) hypertension: Secondary | ICD-10-CM | POA: Diagnosis not present

## 2021-02-13 DIAGNOSIS — N138 Other obstructive and reflux uropathy: Secondary | ICD-10-CM | POA: Diagnosis not present

## 2021-02-13 DIAGNOSIS — E1165 Type 2 diabetes mellitus with hyperglycemia: Secondary | ICD-10-CM | POA: Diagnosis not present

## 2021-02-13 DIAGNOSIS — N401 Enlarged prostate with lower urinary tract symptoms: Secondary | ICD-10-CM | POA: Diagnosis not present

## 2021-02-13 DIAGNOSIS — E782 Mixed hyperlipidemia: Secondary | ICD-10-CM | POA: Diagnosis not present

## 2021-02-13 DIAGNOSIS — E7849 Other hyperlipidemia: Secondary | ICD-10-CM | POA: Diagnosis not present

## 2021-02-15 ENCOUNTER — Other Ambulatory Visit: Payer: Self-pay

## 2021-02-15 ENCOUNTER — Ambulatory Visit: Payer: Medicare Other | Admitting: Cardiovascular Disease

## 2021-02-15 ENCOUNTER — Encounter: Payer: Self-pay | Admitting: *Deleted

## 2021-02-15 ENCOUNTER — Ambulatory Visit: Payer: 59 | Admitting: Cardiovascular Disease

## 2021-02-15 VITALS — BP 112/66 | HR 71 | Ht 69.0 in | Wt 225.0 lb

## 2021-02-15 DIAGNOSIS — M62830 Muscle spasm of back: Secondary | ICD-10-CM | POA: Diagnosis not present

## 2021-02-15 DIAGNOSIS — E118 Type 2 diabetes mellitus with unspecified complications: Secondary | ICD-10-CM

## 2021-02-15 DIAGNOSIS — I10 Essential (primary) hypertension: Secondary | ICD-10-CM

## 2021-02-15 DIAGNOSIS — M9903 Segmental and somatic dysfunction of lumbar region: Secondary | ICD-10-CM | POA: Diagnosis not present

## 2021-02-15 DIAGNOSIS — M9902 Segmental and somatic dysfunction of thoracic region: Secondary | ICD-10-CM | POA: Diagnosis not present

## 2021-02-15 DIAGNOSIS — R079 Chest pain, unspecified: Secondary | ICD-10-CM | POA: Diagnosis not present

## 2021-02-15 DIAGNOSIS — E782 Mixed hyperlipidemia: Secondary | ICD-10-CM | POA: Diagnosis not present

## 2021-02-15 DIAGNOSIS — M546 Pain in thoracic spine: Secondary | ICD-10-CM | POA: Diagnosis not present

## 2021-02-15 NOTE — Patient Instructions (Signed)
Medication Instructions:  Your physician recommends that you continue on your current medications as directed. Please refer to the Current Medication list given to you today.  *If you need a refill on your cardiac medications before your next appointment, please call your pharmacy*   Lab Work: NONE   If you have labs (blood work) drawn today and your tests are completely normal, you will receive your results only by: MyChart Message (if you have MyChart) OR A paper copy in the mail If you have any lab test that is abnormal or we need to change your treatment, we will call you to review the results.   Testing/Procedures: Your physician has requested that you have en exercise stress myoview. For further information please visit [email or web address]. Please follow instruction sheet, as given.    Follow-Up: At [facility], you and your health needs are our priority.  As part of our continuing mission to provide you with exceptional heart care, we have created designated Provider Care Teams.  These Care Teams include your primary Cardiologist (physician) and Advanced Practice Providers (APPs -  Physician Assistants and Nurse Practitioners) who all work together to provide you with the care you need, when you need it.  We recommend signing up for the patient portal called "MyChart".  Sign up information is provided on this After Visit Summary.  MyChart is used to connect with patients for Virtual Visits (Telemedicine).  Patients are able to view lab/test results, encounter notes, upcoming appointments, etc.  Non-urgent messages can be sent to your provider as well.   To learn more about what you can do with MyChart, go to [email or web address].    Your next appointment:    As Needed   The format for your next appointment:   In Person  Provider:   Peter Nishan, MD   Other Instructions Thank you for choosing [facility]!    

## 2021-02-15 NOTE — Progress Notes (Signed)
CARDIOLOGY CONSULT NOTE       Patient ID: Luis Fitzgerald MRN: [id number] DOB/AGE: 67-Mar-1957 67 y.o.  Admit date: (Not on file) Referring Physician: Nadara Mustard Primary Physician: Manon Hilding, MD Primary Cardiologist: New Reason for Consultation: Chest pain  Active Problems:   * No active hospital problems. *   HPI:  65 y.o. 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  Social History   Socioeconomic History   Marital status: Married    Spouse name: Not on file   Number of children: Not on file   Years of education: Not on file   Highest education level: Not on file  Occupational History   Not on file  Tobacco Use   Smoking status: Never   Smokeless tobacco: Never  Substance and Sexual Activity   Alcohol use: No   Drug use: No   Sexual activity: Yes  Other Topics Concern   Not on file  Social History Narrative   Not on file   Social Determinants of Health   Financial Resource Strain: Not on file  Food Insecurity: Not on file  Transportation Needs: Not on file  Physical Activity: Not on file  Stress: Not on file  Social Connections: Not on file  Intimate  Partner Violence: Not on file    No past surgical history on file.    Current Outpatient Medications:    acetaminophen (TYLENOL) 500 MG tablet, Take 1,000 mg by mouth daily., Disp: , Rfl:    atorvastatin (LIPITOR) 10 MG tablet, Take 10 mg by mouth., Disp: , Rfl:    canagliflozin (INVOKANA) 300 MG TABS tablet, Take 300 mg by mouth daily before breakfast., Disp: , Rfl:    celecoxib (CELEBREX) 200 MG capsule, Take 200 mg by mouth daily., Disp: , Rfl:    glipiZIDE (GLUCOTROL XL) 10 MG 24 hr tablet, Take 10 mg by mouth daily with breakfast., Disp: , Rfl:    lisinopril (ZESTRIL) 10 MG tablet, Take 5 mg by mouth daily., Disp: , Rfl:    metFORMIN (GLUCOPHAGE-XR) 500 MG 24 hr tablet, Take 2,000 mg by mouth., Disp: , Rfl:     Physical Exam: Blood pressure 112/66, pulse 71, height 5\' 9"  (1.753 m), weight 102.1 kg, SpO2 96 %.    Affect appropriate Healthy:  appears stated age 65: normal Neck supple with no adenopathy JVP normal no bruits no thyromegaly Lungs clear with no wheezing and good diaphragmatic motion Heart:  S1/S2 no murmur, no rub, gallop or click PMI normal Abdomen: benighn, BS positve, no tenderness, no AAA no bruit.  No HSM or HJR Distal pulses  intact with no bruits Neuro non-focal LUE lymph edema previous wide excision forearm melanoma    Labs:   Lab Results  Component Value Date   WBC 6.3 09/20/2016   HGB 14.8 09/20/2016   HCT 42.8 09/20/2016   MCV 94.7 09/20/2016   PLT 206 09/20/2016   No results for input(s): NA, K, CL, CO2, BUN, CREATININE, CALCIUM, PROT, BILITOT, ALKPHOS, ALT, AST, GLUCOSE in the last 168 hours.  Invalid input(s): LABALBU No results found for: CKTOTAL, CKMB, CKMBINDEX, TROPONINI No results found for: CHOL No results found for: HDL No results found for: LDLCALC No results found for: TRIG No results found for: CHOLHDL No results found for: LDLDIRECT    Radiology: No results found.  EKG: From Dr Quintin Alto office SR rate 75 ICRBBB not  signficiant    ASSESSMENT AND PLAN:   CAD:  risk with DM somewhat atypical pain risk stratify with Ex myovue ECG is essentially normal with  ICRBBB which is not significant  DM:  macular degeneration HLD:  continue statin labs with primary  HTN:  Well controlled.  Continue current medications and low sodium Dash type diet.   Melanoma:  resulting lymph edema LUE not metastatic surveillance imaging per oncology   Ex Myovue Encouraged f/u with Ophthalmologist F/U Cardiology PRN   Signed: Jenkins Rouge 02/15/2021, 12:00 PM

## 2021-02-26 ENCOUNTER — Encounter (HOSPITAL_COMMUNITY)
Admission: RE | Admit: 2021-02-26 | Discharge: 2021-02-26 | Disposition: A | Discharge status: Home / Self Care | Payer: Medicare Other | Source: Ambulatory Visit | Attending: Cardiovascular Disease | Admitting: Cardiovascular Disease

## 2021-02-26 ENCOUNTER — Telehealth: Payer: Self-pay | Admitting: Cardiovascular Disease

## 2021-02-26 ENCOUNTER — Other Ambulatory Visit: Payer: Self-pay

## 2021-02-26 ENCOUNTER — Ambulatory Visit (HOSPITAL_COMMUNITY)
Admission: RE | Admit: 2021-02-26 | Discharge: 2021-02-26 | Disposition: A | Discharge status: Home / Self Care | Payer: Medicare Other | Source: Ambulatory Visit | Attending: Cardiovascular Disease | Admitting: Cardiovascular Disease

## 2021-02-26 DIAGNOSIS — R079 Chest pain, unspecified: Secondary | ICD-10-CM | POA: Diagnosis not present

## 2021-02-26 DIAGNOSIS — R0600 Dyspnea, unspecified: Secondary | ICD-10-CM | POA: Insufficient documentation

## 2021-02-26 LAB — NM MYOCAR MULTI W/SPECT W/WALL MOTION / EF
Estimated workload: 7 METS
Exercise duration (min): 4 min
Exercise duration (sec): 30 s
LV dias vol: 71 mL (ref 62–150)
LV sys vol: 22 mL
MPHR: 154 {beats}/min
Peak HR: 157 {beats}/min
Percent HR: 101 %
RATE: 0.4
RPE: 12
Rest HR: 67 {beats}/min
SDS: 0
SRS: 2
SSS: 2
TID: 1.04

## 2021-02-26 MED ORDER — SODIUM CHLORIDE FLUSH 0.9 % IV SOLN
INTRAVENOUS | Status: AC
  Administered 2021-02-26: 10 mL via INTRAVENOUS
  Filled 2021-02-26: qty 10

## 2021-02-26 MED ORDER — REGADENOSON 0.4 MG/5ML IV SOLN
INTRAVENOUS | Status: AC
  Filled 2021-02-26: qty 5

## 2021-02-26 MED ORDER — TECHNETIUM TC 99M TETROFOSMIN IV KIT
30.0000 | PACK | Freq: Once | INTRAVENOUS | Status: AC | PRN
  Administered 2021-02-26: 28.5 via INTRAVENOUS

## 2021-02-26 MED ORDER — TECHNETIUM TC 99M TETROFOSMIN IV KIT
10.0000 | PACK | Freq: Once | INTRAVENOUS | Status: AC | PRN
  Administered 2021-02-26: 11 via INTRAVENOUS

## 2021-02-26 NOTE — Telephone Encounter (Signed)
Patient said someone called with results from his stress test done today. Please call him back at [phone].

## 2021-02-26 NOTE — Telephone Encounter (Signed)
Results discussed with patient

## 2021-03-14 DIAGNOSIS — M9903 Segmental and somatic dysfunction of lumbar region: Secondary | ICD-10-CM | POA: Diagnosis not present

## 2021-03-14 DIAGNOSIS — M546 Pain in thoracic spine: Secondary | ICD-10-CM | POA: Diagnosis not present

## 2021-03-14 DIAGNOSIS — M62830 Muscle spasm of back: Secondary | ICD-10-CM | POA: Diagnosis not present

## 2021-03-14 DIAGNOSIS — M9902 Segmental and somatic dysfunction of thoracic region: Secondary | ICD-10-CM | POA: Diagnosis not present

## 2021-03-16 DIAGNOSIS — M546 Pain in thoracic spine: Secondary | ICD-10-CM | POA: Diagnosis not present

## 2021-03-16 DIAGNOSIS — M62830 Muscle spasm of back: Secondary | ICD-10-CM | POA: Diagnosis not present

## 2021-03-16 DIAGNOSIS — M9903 Segmental and somatic dysfunction of lumbar region: Secondary | ICD-10-CM | POA: Diagnosis not present

## 2021-03-16 DIAGNOSIS — M9902 Segmental and somatic dysfunction of thoracic region: Secondary | ICD-10-CM | POA: Diagnosis not present

## 2021-04-18 DIAGNOSIS — M62830 Muscle spasm of back: Secondary | ICD-10-CM | POA: Diagnosis not present

## 2021-04-18 DIAGNOSIS — M9902 Segmental and somatic dysfunction of thoracic region: Secondary | ICD-10-CM | POA: Diagnosis not present

## 2021-04-18 DIAGNOSIS — M546 Pain in thoracic spine: Secondary | ICD-10-CM | POA: Diagnosis not present

## 2021-04-18 DIAGNOSIS — M9903 Segmental and somatic dysfunction of lumbar region: Secondary | ICD-10-CM | POA: Diagnosis not present

## 2021-05-02 DIAGNOSIS — R059 Cough, unspecified: Secondary | ICD-10-CM | POA: Diagnosis not present

## 2021-05-02 DIAGNOSIS — R509 Fever, unspecified: Secondary | ICD-10-CM | POA: Diagnosis not present

## 2021-05-16 DIAGNOSIS — M9903 Segmental and somatic dysfunction of lumbar region: Secondary | ICD-10-CM | POA: Diagnosis not present

## 2021-05-16 DIAGNOSIS — M546 Pain in thoracic spine: Secondary | ICD-10-CM | POA: Diagnosis not present

## 2021-05-16 DIAGNOSIS — M62830 Muscle spasm of back: Secondary | ICD-10-CM | POA: Diagnosis not present

## 2021-05-16 DIAGNOSIS — M9902 Segmental and somatic dysfunction of thoracic region: Secondary | ICD-10-CM | POA: Diagnosis not present

## 2021-06-11 DIAGNOSIS — I10 Essential (primary) hypertension: Secondary | ICD-10-CM | POA: Diagnosis not present

## 2021-06-11 DIAGNOSIS — E1140 Type 2 diabetes mellitus with diabetic neuropathy, unspecified: Secondary | ICD-10-CM | POA: Diagnosis not present

## 2021-06-11 DIAGNOSIS — E875 Hyperkalemia: Secondary | ICD-10-CM | POA: Diagnosis not present

## 2021-06-11 DIAGNOSIS — E782 Mixed hyperlipidemia: Secondary | ICD-10-CM | POA: Diagnosis not present

## 2021-06-13 DIAGNOSIS — M9902 Segmental and somatic dysfunction of thoracic region: Secondary | ICD-10-CM | POA: Diagnosis not present

## 2021-06-13 DIAGNOSIS — M9903 Segmental and somatic dysfunction of lumbar region: Secondary | ICD-10-CM | POA: Diagnosis not present

## 2021-06-13 DIAGNOSIS — M62830 Muscle spasm of back: Secondary | ICD-10-CM | POA: Diagnosis not present

## 2021-06-13 DIAGNOSIS — M546 Pain in thoracic spine: Secondary | ICD-10-CM | POA: Diagnosis not present

## 2021-06-14 DIAGNOSIS — I10 Essential (primary) hypertension: Secondary | ICD-10-CM | POA: Diagnosis not present

## 2021-06-14 DIAGNOSIS — E7849 Other hyperlipidemia: Secondary | ICD-10-CM | POA: Diagnosis not present

## 2021-06-14 DIAGNOSIS — E1165 Type 2 diabetes mellitus with hyperglycemia: Secondary | ICD-10-CM | POA: Diagnosis not present

## 2021-06-14 DIAGNOSIS — N401 Enlarged prostate with lower urinary tract symptoms: Secondary | ICD-10-CM | POA: Diagnosis not present

## 2021-08-15 DIAGNOSIS — M9901 Segmental and somatic dysfunction of cervical region: Secondary | ICD-10-CM | POA: Diagnosis not present

## 2021-08-15 DIAGNOSIS — M9902 Segmental and somatic dysfunction of thoracic region: Secondary | ICD-10-CM | POA: Diagnosis not present

## 2021-08-15 DIAGNOSIS — M542 Cervicalgia: Secondary | ICD-10-CM | POA: Diagnosis not present

## 2021-08-15 DIAGNOSIS — M9905 Segmental and somatic dysfunction of pelvic region: Secondary | ICD-10-CM | POA: Diagnosis not present

## 2021-08-15 DIAGNOSIS — M9903 Segmental and somatic dysfunction of lumbar region: Secondary | ICD-10-CM | POA: Diagnosis not present

## 2021-08-15 DIAGNOSIS — M62830 Muscle spasm of back: Secondary | ICD-10-CM | POA: Diagnosis not present

## 2021-08-15 DIAGNOSIS — M546 Pain in thoracic spine: Secondary | ICD-10-CM | POA: Diagnosis not present

## 2021-08-20 DIAGNOSIS — L7211 Pilar cyst: Secondary | ICD-10-CM | POA: Diagnosis not present

## 2021-08-20 DIAGNOSIS — Z08 Encounter for follow-up examination after completed treatment for malignant neoplasm: Secondary | ICD-10-CM | POA: Diagnosis not present

## 2021-08-20 DIAGNOSIS — Z85820 Personal history of malignant melanoma of skin: Secondary | ICD-10-CM | POA: Diagnosis not present

## 2021-08-20 DIAGNOSIS — L570 Actinic keratosis: Secondary | ICD-10-CM | POA: Diagnosis not present

## 2021-08-20 DIAGNOSIS — Z1283 Encounter for screening for malignant neoplasm of skin: Secondary | ICD-10-CM | POA: Diagnosis not present

## 2021-08-20 DIAGNOSIS — D225 Melanocytic nevi of trunk: Secondary | ICD-10-CM | POA: Diagnosis not present

## 2021-08-20 DIAGNOSIS — B078 Other viral warts: Secondary | ICD-10-CM | POA: Diagnosis not present

## 2021-08-20 DIAGNOSIS — X32XXXD Exposure to sunlight, subsequent encounter: Secondary | ICD-10-CM | POA: Diagnosis not present

## 2021-09-12 DIAGNOSIS — M542 Cervicalgia: Secondary | ICD-10-CM | POA: Diagnosis not present

## 2021-09-12 DIAGNOSIS — M62830 Muscle spasm of back: Secondary | ICD-10-CM | POA: Diagnosis not present

## 2021-09-12 DIAGNOSIS — M9903 Segmental and somatic dysfunction of lumbar region: Secondary | ICD-10-CM | POA: Diagnosis not present

## 2021-09-12 DIAGNOSIS — M9905 Segmental and somatic dysfunction of pelvic region: Secondary | ICD-10-CM | POA: Diagnosis not present

## 2021-09-12 DIAGNOSIS — M9901 Segmental and somatic dysfunction of cervical region: Secondary | ICD-10-CM | POA: Diagnosis not present

## 2021-09-12 DIAGNOSIS — M9902 Segmental and somatic dysfunction of thoracic region: Secondary | ICD-10-CM | POA: Diagnosis not present

## 2021-09-12 DIAGNOSIS — M546 Pain in thoracic spine: Secondary | ICD-10-CM | POA: Diagnosis not present

## 2021-10-05 DIAGNOSIS — L0390 Cellulitis, unspecified: Secondary | ICD-10-CM | POA: Diagnosis not present

## 2021-10-05 DIAGNOSIS — I891 Lymphangitis: Secondary | ICD-10-CM | POA: Diagnosis not present

## 2021-10-10 DIAGNOSIS — M9902 Segmental and somatic dysfunction of thoracic region: Secondary | ICD-10-CM | POA: Diagnosis not present

## 2021-10-10 DIAGNOSIS — M546 Pain in thoracic spine: Secondary | ICD-10-CM | POA: Diagnosis not present

## 2021-10-10 DIAGNOSIS — M542 Cervicalgia: Secondary | ICD-10-CM | POA: Diagnosis not present

## 2021-10-10 DIAGNOSIS — M62830 Muscle spasm of back: Secondary | ICD-10-CM | POA: Diagnosis not present

## 2021-10-10 DIAGNOSIS — M9901 Segmental and somatic dysfunction of cervical region: Secondary | ICD-10-CM | POA: Diagnosis not present

## 2021-10-10 DIAGNOSIS — M9905 Segmental and somatic dysfunction of pelvic region: Secondary | ICD-10-CM | POA: Diagnosis not present

## 2021-10-10 DIAGNOSIS — M9903 Segmental and somatic dysfunction of lumbar region: Secondary | ICD-10-CM | POA: Diagnosis not present

## 2021-10-16 DIAGNOSIS — I10 Essential (primary) hypertension: Secondary | ICD-10-CM | POA: Diagnosis not present

## 2021-10-16 DIAGNOSIS — E1140 Type 2 diabetes mellitus with diabetic neuropathy, unspecified: Secondary | ICD-10-CM | POA: Diagnosis not present

## 2021-10-16 DIAGNOSIS — E7801 Familial hypercholesterolemia: Secondary | ICD-10-CM | POA: Diagnosis not present

## 2021-10-16 DIAGNOSIS — E7849 Other hyperlipidemia: Secondary | ICD-10-CM | POA: Diagnosis not present

## 2021-10-16 DIAGNOSIS — E1165 Type 2 diabetes mellitus with hyperglycemia: Secondary | ICD-10-CM | POA: Diagnosis not present

## 2021-10-16 DIAGNOSIS — E7800 Pure hypercholesterolemia, unspecified: Secondary | ICD-10-CM | POA: Diagnosis not present

## 2021-10-16 DIAGNOSIS — E782 Mixed hyperlipidemia: Secondary | ICD-10-CM | POA: Diagnosis not present

## 2021-10-19 DIAGNOSIS — E1140 Type 2 diabetes mellitus with diabetic neuropathy, unspecified: Secondary | ICD-10-CM | POA: Diagnosis not present

## 2021-10-19 DIAGNOSIS — N138 Other obstructive and reflux uropathy: Secondary | ICD-10-CM | POA: Diagnosis not present

## 2021-10-19 DIAGNOSIS — Z1331 Encounter for screening for depression: Secondary | ICD-10-CM | POA: Diagnosis not present

## 2021-10-19 DIAGNOSIS — I10 Essential (primary) hypertension: Secondary | ICD-10-CM | POA: Diagnosis not present

## 2021-10-19 DIAGNOSIS — Z1389 Encounter for screening for other disorder: Secondary | ICD-10-CM | POA: Diagnosis not present

## 2021-10-19 DIAGNOSIS — E7849 Other hyperlipidemia: Secondary | ICD-10-CM | POA: Diagnosis not present

## 2021-10-19 DIAGNOSIS — E1165 Type 2 diabetes mellitus with hyperglycemia: Secondary | ICD-10-CM | POA: Diagnosis not present

## 2021-10-19 DIAGNOSIS — N401 Enlarged prostate with lower urinary tract symptoms: Secondary | ICD-10-CM | POA: Diagnosis not present

## 2021-11-28 DIAGNOSIS — Z803 Family history of malignant neoplasm of breast: Secondary | ICD-10-CM | POA: Diagnosis not present

## 2021-11-28 DIAGNOSIS — M2550 Pain in unspecified joint: Secondary | ICD-10-CM | POA: Diagnosis not present

## 2021-11-28 DIAGNOSIS — Z8249 Family history of ischemic heart disease and other diseases of the circulatory system: Secondary | ICD-10-CM | POA: Diagnosis not present

## 2021-11-28 DIAGNOSIS — Z6833 Body mass index (BMI) 33.0-33.9, adult: Secondary | ICD-10-CM | POA: Diagnosis not present

## 2021-11-28 DIAGNOSIS — Z791 Long term (current) use of non-steroidal anti-inflammatories (NSAID): Secondary | ICD-10-CM | POA: Diagnosis not present

## 2021-11-28 DIAGNOSIS — I10 Essential (primary) hypertension: Secondary | ICD-10-CM | POA: Diagnosis not present

## 2021-11-28 DIAGNOSIS — E785 Hyperlipidemia, unspecified: Secondary | ICD-10-CM | POA: Diagnosis not present

## 2021-11-28 DIAGNOSIS — E119 Type 2 diabetes mellitus without complications: Secondary | ICD-10-CM | POA: Diagnosis not present

## 2021-11-28 DIAGNOSIS — Z7984 Long term (current) use of oral hypoglycemic drugs: Secondary | ICD-10-CM | POA: Diagnosis not present

## 2021-11-28 DIAGNOSIS — N529 Male erectile dysfunction, unspecified: Secondary | ICD-10-CM | POA: Diagnosis not present

## 2021-11-28 DIAGNOSIS — E669 Obesity, unspecified: Secondary | ICD-10-CM | POA: Diagnosis not present

## 2021-11-28 DIAGNOSIS — Z833 Family history of diabetes mellitus: Secondary | ICD-10-CM | POA: Diagnosis not present

## 2021-11-28 DIAGNOSIS — Z008 Encounter for other general examination: Secondary | ICD-10-CM | POA: Diagnosis not present

## 2021-12-17 DIAGNOSIS — M546 Pain in thoracic spine: Secondary | ICD-10-CM | POA: Diagnosis not present

## 2021-12-17 DIAGNOSIS — M62830 Muscle spasm of back: Secondary | ICD-10-CM | POA: Diagnosis not present

## 2021-12-17 DIAGNOSIS — M9902 Segmental and somatic dysfunction of thoracic region: Secondary | ICD-10-CM | POA: Diagnosis not present

## 2021-12-17 DIAGNOSIS — M9905 Segmental and somatic dysfunction of pelvic region: Secondary | ICD-10-CM | POA: Diagnosis not present

## 2021-12-17 DIAGNOSIS — M9903 Segmental and somatic dysfunction of lumbar region: Secondary | ICD-10-CM | POA: Diagnosis not present

## 2021-12-17 DIAGNOSIS — M9901 Segmental and somatic dysfunction of cervical region: Secondary | ICD-10-CM | POA: Diagnosis not present

## 2021-12-17 DIAGNOSIS — M542 Cervicalgia: Secondary | ICD-10-CM | POA: Diagnosis not present

## 2022-01-16 DIAGNOSIS — M9901 Segmental and somatic dysfunction of cervical region: Secondary | ICD-10-CM | POA: Diagnosis not present

## 2022-01-16 DIAGNOSIS — M546 Pain in thoracic spine: Secondary | ICD-10-CM | POA: Diagnosis not present

## 2022-01-16 DIAGNOSIS — M62830 Muscle spasm of back: Secondary | ICD-10-CM | POA: Diagnosis not present

## 2022-01-16 DIAGNOSIS — M9902 Segmental and somatic dysfunction of thoracic region: Secondary | ICD-10-CM | POA: Diagnosis not present

## 2022-01-16 DIAGNOSIS — M9903 Segmental and somatic dysfunction of lumbar region: Secondary | ICD-10-CM | POA: Diagnosis not present

## 2022-01-16 DIAGNOSIS — M542 Cervicalgia: Secondary | ICD-10-CM | POA: Diagnosis not present

## 2022-01-16 DIAGNOSIS — M9905 Segmental and somatic dysfunction of pelvic region: Secondary | ICD-10-CM | POA: Diagnosis not present

## 2022-02-13 DIAGNOSIS — M546 Pain in thoracic spine: Secondary | ICD-10-CM | POA: Diagnosis not present

## 2022-02-13 DIAGNOSIS — M9905 Segmental and somatic dysfunction of pelvic region: Secondary | ICD-10-CM | POA: Diagnosis not present

## 2022-02-13 DIAGNOSIS — M542 Cervicalgia: Secondary | ICD-10-CM | POA: Diagnosis not present

## 2022-02-13 DIAGNOSIS — M62830 Muscle spasm of back: Secondary | ICD-10-CM | POA: Diagnosis not present

## 2022-02-13 DIAGNOSIS — M9902 Segmental and somatic dysfunction of thoracic region: Secondary | ICD-10-CM | POA: Diagnosis not present

## 2022-02-13 DIAGNOSIS — M9901 Segmental and somatic dysfunction of cervical region: Secondary | ICD-10-CM | POA: Diagnosis not present

## 2022-02-13 DIAGNOSIS — M9903 Segmental and somatic dysfunction of lumbar region: Secondary | ICD-10-CM | POA: Diagnosis not present

## 2022-02-15 DIAGNOSIS — I10 Essential (primary) hypertension: Secondary | ICD-10-CM | POA: Diagnosis not present

## 2022-02-15 DIAGNOSIS — E1165 Type 2 diabetes mellitus with hyperglycemia: Secondary | ICD-10-CM | POA: Diagnosis not present

## 2022-02-15 DIAGNOSIS — E7849 Other hyperlipidemia: Secondary | ICD-10-CM | POA: Diagnosis not present

## 2022-02-15 DIAGNOSIS — E875 Hyperkalemia: Secondary | ICD-10-CM | POA: Diagnosis not present

## 2022-02-15 DIAGNOSIS — E7801 Familial hypercholesterolemia: Secondary | ICD-10-CM | POA: Diagnosis not present

## 2022-02-19 DIAGNOSIS — I10 Essential (primary) hypertension: Secondary | ICD-10-CM | POA: Diagnosis not present

## 2022-02-19 DIAGNOSIS — E7849 Other hyperlipidemia: Secondary | ICD-10-CM | POA: Diagnosis not present

## 2022-02-19 DIAGNOSIS — E875 Hyperkalemia: Secondary | ICD-10-CM | POA: Diagnosis not present

## 2022-02-19 DIAGNOSIS — E1165 Type 2 diabetes mellitus with hyperglycemia: Secondary | ICD-10-CM | POA: Diagnosis not present

## 2022-02-19 DIAGNOSIS — E7801 Familial hypercholesterolemia: Secondary | ICD-10-CM | POA: Diagnosis not present

## 2022-02-21 DIAGNOSIS — E1140 Type 2 diabetes mellitus with diabetic neuropathy, unspecified: Secondary | ICD-10-CM | POA: Diagnosis not present

## 2022-02-21 DIAGNOSIS — E1165 Type 2 diabetes mellitus with hyperglycemia: Secondary | ICD-10-CM | POA: Diagnosis not present

## 2022-02-21 DIAGNOSIS — N401 Enlarged prostate with lower urinary tract symptoms: Secondary | ICD-10-CM | POA: Diagnosis not present

## 2022-02-21 DIAGNOSIS — E6609 Other obesity due to excess calories: Secondary | ICD-10-CM | POA: Diagnosis not present

## 2022-02-21 DIAGNOSIS — E875 Hyperkalemia: Secondary | ICD-10-CM | POA: Diagnosis not present

## 2022-02-21 DIAGNOSIS — E7849 Other hyperlipidemia: Secondary | ICD-10-CM | POA: Diagnosis not present

## 2022-02-21 DIAGNOSIS — Z85820 Personal history of malignant melanoma of skin: Secondary | ICD-10-CM | POA: Diagnosis not present

## 2022-02-21 DIAGNOSIS — Z23 Encounter for immunization: Secondary | ICD-10-CM | POA: Diagnosis not present

## 2022-02-21 DIAGNOSIS — N138 Other obstructive and reflux uropathy: Secondary | ICD-10-CM | POA: Diagnosis not present

## 2022-02-21 DIAGNOSIS — Z6833 Body mass index (BMI) 33.0-33.9, adult: Secondary | ICD-10-CM | POA: Diagnosis not present

## 2022-02-21 DIAGNOSIS — I10 Essential (primary) hypertension: Secondary | ICD-10-CM | POA: Diagnosis not present

## 2022-03-13 DIAGNOSIS — M9905 Segmental and somatic dysfunction of pelvic region: Secondary | ICD-10-CM | POA: Diagnosis not present

## 2022-03-13 DIAGNOSIS — M9902 Segmental and somatic dysfunction of thoracic region: Secondary | ICD-10-CM | POA: Diagnosis not present

## 2022-03-13 DIAGNOSIS — M542 Cervicalgia: Secondary | ICD-10-CM | POA: Diagnosis not present

## 2022-03-13 DIAGNOSIS — M546 Pain in thoracic spine: Secondary | ICD-10-CM | POA: Diagnosis not present

## 2022-03-13 DIAGNOSIS — M9901 Segmental and somatic dysfunction of cervical region: Secondary | ICD-10-CM | POA: Diagnosis not present

## 2022-03-13 DIAGNOSIS — M9903 Segmental and somatic dysfunction of lumbar region: Secondary | ICD-10-CM | POA: Diagnosis not present

## 2022-03-13 DIAGNOSIS — M62830 Muscle spasm of back: Secondary | ICD-10-CM | POA: Diagnosis not present

## 2022-04-04 DIAGNOSIS — Z189 Retained foreign body fragments, unspecified material: Secondary | ICD-10-CM | POA: Diagnosis not present

## 2022-04-04 DIAGNOSIS — L570 Actinic keratosis: Secondary | ICD-10-CM | POA: Diagnosis not present

## 2022-04-04 DIAGNOSIS — L923 Foreign body granuloma of the skin and subcutaneous tissue: Secondary | ICD-10-CM | POA: Diagnosis not present

## 2022-04-04 DIAGNOSIS — X32XXXD Exposure to sunlight, subsequent encounter: Secondary | ICD-10-CM | POA: Diagnosis not present

## 2022-06-11 DIAGNOSIS — H5203 Hypermetropia, bilateral: Secondary | ICD-10-CM | POA: Diagnosis not present

## 2022-06-11 DIAGNOSIS — H52209 Unspecified astigmatism, unspecified eye: Secondary | ICD-10-CM | POA: Diagnosis not present

## 2022-06-11 DIAGNOSIS — H524 Presbyopia: Secondary | ICD-10-CM | POA: Diagnosis not present

## 2022-06-18 DIAGNOSIS — E7849 Other hyperlipidemia: Secondary | ICD-10-CM | POA: Diagnosis not present

## 2022-06-18 DIAGNOSIS — I10 Essential (primary) hypertension: Secondary | ICD-10-CM | POA: Diagnosis not present

## 2022-06-18 DIAGNOSIS — E7801 Familial hypercholesterolemia: Secondary | ICD-10-CM | POA: Diagnosis not present

## 2022-06-18 DIAGNOSIS — E1165 Type 2 diabetes mellitus with hyperglycemia: Secondary | ICD-10-CM | POA: Diagnosis not present

## 2022-06-18 DIAGNOSIS — E875 Hyperkalemia: Secondary | ICD-10-CM | POA: Diagnosis not present

## 2022-06-25 DIAGNOSIS — E1140 Type 2 diabetes mellitus with diabetic neuropathy, unspecified: Secondary | ICD-10-CM | POA: Diagnosis not present

## 2022-06-25 DIAGNOSIS — N401 Enlarged prostate with lower urinary tract symptoms: Secondary | ICD-10-CM | POA: Diagnosis not present

## 2022-06-25 DIAGNOSIS — Z23 Encounter for immunization: Secondary | ICD-10-CM | POA: Diagnosis not present

## 2022-06-25 DIAGNOSIS — E875 Hyperkalemia: Secondary | ICD-10-CM | POA: Diagnosis not present

## 2022-06-25 DIAGNOSIS — N138 Other obstructive and reflux uropathy: Secondary | ICD-10-CM | POA: Diagnosis not present

## 2022-06-25 DIAGNOSIS — Z85820 Personal history of malignant melanoma of skin: Secondary | ICD-10-CM | POA: Diagnosis not present

## 2022-06-25 DIAGNOSIS — Z6834 Body mass index (BMI) 34.0-34.9, adult: Secondary | ICD-10-CM | POA: Diagnosis not present

## 2022-06-25 DIAGNOSIS — E1165 Type 2 diabetes mellitus with hyperglycemia: Secondary | ICD-10-CM | POA: Diagnosis not present

## 2022-06-25 DIAGNOSIS — E7849 Other hyperlipidemia: Secondary | ICD-10-CM | POA: Diagnosis not present

## 2022-06-25 DIAGNOSIS — I10 Essential (primary) hypertension: Secondary | ICD-10-CM | POA: Diagnosis not present

## 2022-06-25 DIAGNOSIS — E6609 Other obesity due to excess calories: Secondary | ICD-10-CM | POA: Diagnosis not present

## 2022-07-17 DIAGNOSIS — M9905 Segmental and somatic dysfunction of pelvic region: Secondary | ICD-10-CM | POA: Diagnosis not present

## 2022-07-17 DIAGNOSIS — M9902 Segmental and somatic dysfunction of thoracic region: Secondary | ICD-10-CM | POA: Diagnosis not present

## 2022-07-17 DIAGNOSIS — M25562 Pain in left knee: Secondary | ICD-10-CM | POA: Diagnosis not present

## 2022-07-17 DIAGNOSIS — M62830 Muscle spasm of back: Secondary | ICD-10-CM | POA: Diagnosis not present

## 2022-07-17 DIAGNOSIS — M9903 Segmental and somatic dysfunction of lumbar region: Secondary | ICD-10-CM | POA: Diagnosis not present

## 2022-07-17 DIAGNOSIS — M542 Cervicalgia: Secondary | ICD-10-CM | POA: Diagnosis not present

## 2022-07-17 DIAGNOSIS — M546 Pain in thoracic spine: Secondary | ICD-10-CM | POA: Diagnosis not present

## 2022-07-17 DIAGNOSIS — M9901 Segmental and somatic dysfunction of cervical region: Secondary | ICD-10-CM | POA: Diagnosis not present

## 2022-07-23 DIAGNOSIS — Z23 Encounter for immunization: Secondary | ICD-10-CM | POA: Diagnosis not present

## 2022-07-25 DIAGNOSIS — M546 Pain in thoracic spine: Secondary | ICD-10-CM | POA: Diagnosis not present

## 2022-07-25 DIAGNOSIS — M9905 Segmental and somatic dysfunction of pelvic region: Secondary | ICD-10-CM | POA: Diagnosis not present

## 2022-07-25 DIAGNOSIS — M9902 Segmental and somatic dysfunction of thoracic region: Secondary | ICD-10-CM | POA: Diagnosis not present

## 2022-07-25 DIAGNOSIS — M9901 Segmental and somatic dysfunction of cervical region: Secondary | ICD-10-CM | POA: Diagnosis not present

## 2022-07-25 DIAGNOSIS — M25562 Pain in left knee: Secondary | ICD-10-CM | POA: Diagnosis not present

## 2022-07-25 DIAGNOSIS — M62830 Muscle spasm of back: Secondary | ICD-10-CM | POA: Diagnosis not present

## 2022-07-25 DIAGNOSIS — M9903 Segmental and somatic dysfunction of lumbar region: Secondary | ICD-10-CM | POA: Diagnosis not present

## 2022-07-25 DIAGNOSIS — M542 Cervicalgia: Secondary | ICD-10-CM | POA: Diagnosis not present

## 2022-08-01 DIAGNOSIS — M25562 Pain in left knee: Secondary | ICD-10-CM | POA: Diagnosis not present

## 2022-08-01 DIAGNOSIS — M546 Pain in thoracic spine: Secondary | ICD-10-CM | POA: Diagnosis not present

## 2022-08-01 DIAGNOSIS — M9901 Segmental and somatic dysfunction of cervical region: Secondary | ICD-10-CM | POA: Diagnosis not present

## 2022-08-01 DIAGNOSIS — M9902 Segmental and somatic dysfunction of thoracic region: Secondary | ICD-10-CM | POA: Diagnosis not present

## 2022-08-01 DIAGNOSIS — M62830 Muscle spasm of back: Secondary | ICD-10-CM | POA: Diagnosis not present

## 2022-08-01 DIAGNOSIS — M9903 Segmental and somatic dysfunction of lumbar region: Secondary | ICD-10-CM | POA: Diagnosis not present

## 2022-08-01 DIAGNOSIS — M542 Cervicalgia: Secondary | ICD-10-CM | POA: Diagnosis not present

## 2022-08-01 DIAGNOSIS — M9905 Segmental and somatic dysfunction of pelvic region: Secondary | ICD-10-CM | POA: Diagnosis not present

## 2022-08-07 DIAGNOSIS — M9903 Segmental and somatic dysfunction of lumbar region: Secondary | ICD-10-CM | POA: Diagnosis not present

## 2022-08-07 DIAGNOSIS — M542 Cervicalgia: Secondary | ICD-10-CM | POA: Diagnosis not present

## 2022-08-07 DIAGNOSIS — M9901 Segmental and somatic dysfunction of cervical region: Secondary | ICD-10-CM | POA: Diagnosis not present

## 2022-08-07 DIAGNOSIS — M546 Pain in thoracic spine: Secondary | ICD-10-CM | POA: Diagnosis not present

## 2022-08-07 DIAGNOSIS — M62830 Muscle spasm of back: Secondary | ICD-10-CM | POA: Diagnosis not present

## 2022-08-07 DIAGNOSIS — M9902 Segmental and somatic dysfunction of thoracic region: Secondary | ICD-10-CM | POA: Diagnosis not present

## 2022-08-07 DIAGNOSIS — M9905 Segmental and somatic dysfunction of pelvic region: Secondary | ICD-10-CM | POA: Diagnosis not present

## 2022-08-07 DIAGNOSIS — M25562 Pain in left knee: Secondary | ICD-10-CM | POA: Diagnosis not present

## 2022-08-12 ENCOUNTER — Ambulatory Visit (INDEPENDENT_AMBULATORY_CARE_PROVIDER_SITE_OTHER): Payer: Medicare HMO | Admitting: Orthopaedic Surgery

## 2022-08-12 ENCOUNTER — Ambulatory Visit (INDEPENDENT_AMBULATORY_CARE_PROVIDER_SITE_OTHER): Payer: Medicare HMO

## 2022-08-12 VITALS — Ht 69.0 in | Wt 237.0 lb

## 2022-08-12 DIAGNOSIS — G8929 Other chronic pain: Secondary | ICD-10-CM

## 2022-08-12 DIAGNOSIS — M25562 Pain in left knee: Secondary | ICD-10-CM

## 2022-08-12 DIAGNOSIS — M1712 Unilateral primary osteoarthritis, left knee: Secondary | ICD-10-CM | POA: Diagnosis not present

## 2022-08-12 MED ORDER — LIDOCAINE HCL 1 % IJ SOLN
3.0000 mL | INTRAMUSCULAR | Status: AC | PRN
  Administered 2022-08-12: 3 mL

## 2022-08-12 MED ORDER — METHYLPREDNISOLONE ACETATE 40 MG/ML IJ SUSP
40.0000 mg | INTRAMUSCULAR | Status: AC | PRN
  Administered 2022-08-12: 40 mg via INTRA_ARTICULAR

## 2022-08-12 NOTE — Progress Notes (Signed)
Office Visit Note   Patient: Luis Fitzgerald           Date of Birth: June 20, 1956           MRN: [id number] Visit Date: 08/12/2022              Requested by: Sasser, Silvestre Moment, MD [place],   [id number] PCP: Manon Hilding, MD   Assessment & Plan: Visit Diagnoses:  1. Chronic pain of left knee   2. Unilateral primary osteoarthritis, left knee     Plan: We talked in length in detail about his x-rays and his knee osteoarthritis.  The only surgical intervention that would help in the long run would be a knee replacement.  However it is worth certainly trying conservative treatment including quad strengthening exercises.  I did offer steroid injection in his knee today which he tolerated well.  Will see him back in 6 weeks to see how he is doing from the injection.  We may even consider hyaluronic acid.  All questions and concerns were answered and addressed.  Follow-Up Instructions: Return in about 6 weeks (around 09/23/2022).   Orders:  Orders Placed This Encounter  Procedures   Large Joint Inj   XR Knee 1-2 Views Left   No orders of the defined types were placed in this encounter.     Procedures: Large Joint Inj: L knee on 08/12/2022 10:08 AM Indications: diagnostic evaluation and pain Details: 22 G 1.5 in needle, superolateral approach  Arthrogram: No  Medications: 3 mL lidocaine 1 %; 40 mg methylPREDNISolone acetate 40 MG/ML Outcome: tolerated well, no immediate complications Procedure, treatment alternatives, risks and benefits explained, specific risks discussed. Consent was given by the patient. Immediately prior to procedure a time out was called to verify the correct patient, procedure, equipment, support staff and site/side marked as required. Patient was prepped and draped in the usual sterile fashion.       Clinical Data: No additional findings.   Subjective: Chief Complaint  Patient presents with   Left Knee - Pain  The patient is a 61 old  gentleman I am seeing for the first time with direct but I have taken care of family members and other friends.  He comes in with left knee pain has been hurting for several years now and slowly getting worse.  Is mainly medial aspect of his knee but also really posterior.  He has a previous history of a right knee arthroscopy years ago.  He also has a history melanoma that was removed from his left arm with significant lymph node involvement.  This is left him with permanent lymphedema of his left upper extremity.  He does report some locking catching with his left knee but it mainly hurts with weightbearing activities.  He has not had any type of conservative treatment for his left knee either.  He is a diabetic and reports a hemoglobin A1c of 7.  HPI  Review of Systems There is currently listed no chest pain, shortness of breath, fever, chills, nausea, vomiting  Objective: Vital Signs: Ht '5\' 9"'$  (1.753 m)   Wt 237 lb (107.5 kg)   BMI 35.00 kg/m   Physical Exam He is alert and orient x 3 and in no acute distress Ortho Exam Examination of his left knee shows no effusion.  He has excellent range of motion of the knee with some patellofemoral crepitation.  There is slight varus malalignment that is correctable.  There is  posterior medial tenderness and medial joint line tenderness. Specialty Comments:  No specialty comments available.  Imaging: XR Knee 1-2 Views Left  Result Date: 08/12/2022 2 views of the left knee show tricompartment arthritis involving mainly the medial and patellofemoral compartments.  There is joint space narrowing and osteophytes as well as slight varus malalignment.    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  No past surgical history on file. Social History   Occupational History   Not on file  Tobacco Use   Smoking status: Never   Smokeless tobacco: Never  Substance and Sexual Activity   Alcohol use: No   Drug use: No   Sexual activity: Yes

## 2022-08-22 DIAGNOSIS — D225 Melanocytic nevi of trunk: Secondary | ICD-10-CM | POA: Diagnosis not present

## 2022-08-22 DIAGNOSIS — X32XXXD Exposure to sunlight, subsequent encounter: Secondary | ICD-10-CM | POA: Diagnosis not present

## 2022-08-22 DIAGNOSIS — C4441 Basal cell carcinoma of skin of scalp and neck: Secondary | ICD-10-CM | POA: Diagnosis not present

## 2022-08-22 DIAGNOSIS — L308 Other specified dermatitis: Secondary | ICD-10-CM | POA: Diagnosis not present

## 2022-08-22 DIAGNOSIS — Z85820 Personal history of malignant melanoma of skin: Secondary | ICD-10-CM | POA: Diagnosis not present

## 2022-08-22 DIAGNOSIS — Z1283 Encounter for screening for malignant neoplasm of skin: Secondary | ICD-10-CM | POA: Diagnosis not present

## 2022-08-22 DIAGNOSIS — L570 Actinic keratosis: Secondary | ICD-10-CM | POA: Diagnosis not present

## 2022-08-22 DIAGNOSIS — Z08 Encounter for follow-up examination after completed treatment for malignant neoplasm: Secondary | ICD-10-CM | POA: Diagnosis not present

## 2022-09-20 DIAGNOSIS — M9901 Segmental and somatic dysfunction of cervical region: Secondary | ICD-10-CM | POA: Diagnosis not present

## 2022-09-20 DIAGNOSIS — M9902 Segmental and somatic dysfunction of thoracic region: Secondary | ICD-10-CM | POA: Diagnosis not present

## 2022-09-20 DIAGNOSIS — M542 Cervicalgia: Secondary | ICD-10-CM | POA: Diagnosis not present

## 2022-09-20 DIAGNOSIS — M9903 Segmental and somatic dysfunction of lumbar region: Secondary | ICD-10-CM | POA: Diagnosis not present

## 2022-09-20 DIAGNOSIS — M546 Pain in thoracic spine: Secondary | ICD-10-CM | POA: Diagnosis not present

## 2022-09-20 DIAGNOSIS — M62830 Muscle spasm of back: Secondary | ICD-10-CM | POA: Diagnosis not present

## 2022-09-20 DIAGNOSIS — M25562 Pain in left knee: Secondary | ICD-10-CM | POA: Diagnosis not present

## 2022-09-20 DIAGNOSIS — M9905 Segmental and somatic dysfunction of pelvic region: Secondary | ICD-10-CM | POA: Diagnosis not present

## 2022-09-23 ENCOUNTER — Ambulatory Visit (INDEPENDENT_AMBULATORY_CARE_PROVIDER_SITE_OTHER): Payer: Medicare HMO | Admitting: Orthopaedic Surgery

## 2022-09-23 ENCOUNTER — Encounter: Payer: Self-pay | Admitting: Orthopaedic Surgery

## 2022-09-23 DIAGNOSIS — M25562 Pain in left knee: Secondary | ICD-10-CM | POA: Diagnosis not present

## 2022-09-23 DIAGNOSIS — G8929 Other chronic pain: Secondary | ICD-10-CM | POA: Diagnosis not present

## 2022-09-23 DIAGNOSIS — M1712 Unilateral primary osteoarthritis, left knee: Secondary | ICD-10-CM

## 2022-09-23 NOTE — Progress Notes (Signed)
The patient comes for continued follow-up as it relates to his left knee pain and osteoarthritis.  He did have a steroid injection in his knee that helped for a few weeks but now the pain has come back.  He has never had hyaluronic acid to treat the pain from osteoarthritis.  Examination of his left knee does show varus malalignment that is correctable.  His x-rays do show moderate tricompartment arthritis that is slowly getting worse.  He has good range of motion of the knee.  He describes pain when has been sitting for long period time and the knee is stiff when he gets up to move.  It does not wake him up at night though.  He is appropriate candidate for hyaluronic acid.  Will see if we get this ordered and approved for his knee to treat the pain from osteoarthritis and he does wish to try to still work on the conservative side of things in terms of avoiding knee replacement surgery unless all conservative treatment measures fail.  This patient is diagnosed with osteoarthritis of the knee(s).    Radiographs show evidence of joint space narrowing, osteophytes, subchondral sclerosis and/or subchondral cysts.  This patient has knee pain which interferes with functional and activities of daily living.    This patient has experienced inadequate response, adverse effects and/or intolerance with conservative treatments such as acetaminophen, NSAIDS, topical creams, physical therapy or regular exercise, knee bracing and/or weight loss.   This patient has experienced inadequate response or has a contraindication to intra articular steroid injections for at least 3 months.   This patient is not scheduled to have a total knee replacement within 6 months of starting treatment with viscosupplementation.

## 2022-10-03 DIAGNOSIS — Z85828 Personal history of other malignant neoplasm of skin: Secondary | ICD-10-CM | POA: Diagnosis not present

## 2022-10-03 DIAGNOSIS — B9689 Other specified bacterial agents as the cause of diseases classified elsewhere: Secondary | ICD-10-CM | POA: Diagnosis not present

## 2022-10-03 DIAGNOSIS — C44212 Basal cell carcinoma of skin of right ear and external auricular canal: Secondary | ICD-10-CM | POA: Diagnosis not present

## 2022-10-03 DIAGNOSIS — Z08 Encounter for follow-up examination after completed treatment for malignant neoplasm: Secondary | ICD-10-CM | POA: Diagnosis not present

## 2022-10-03 DIAGNOSIS — L0202 Furuncle of face: Secondary | ICD-10-CM | POA: Diagnosis not present

## 2022-10-10 DIAGNOSIS — M25521 Pain in right elbow: Secondary | ICD-10-CM | POA: Diagnosis not present

## 2022-10-10 DIAGNOSIS — Z6835 Body mass index (BMI) 35.0-35.9, adult: Secondary | ICD-10-CM | POA: Diagnosis not present

## 2022-10-16 DIAGNOSIS — E875 Hyperkalemia: Secondary | ICD-10-CM | POA: Diagnosis not present

## 2022-10-16 DIAGNOSIS — E7849 Other hyperlipidemia: Secondary | ICD-10-CM | POA: Diagnosis not present

## 2022-10-16 DIAGNOSIS — E1165 Type 2 diabetes mellitus with hyperglycemia: Secondary | ICD-10-CM | POA: Diagnosis not present

## 2022-10-16 DIAGNOSIS — E7801 Familial hypercholesterolemia: Secondary | ICD-10-CM | POA: Diagnosis not present

## 2022-10-16 DIAGNOSIS — E1140 Type 2 diabetes mellitus with diabetic neuropathy, unspecified: Secondary | ICD-10-CM | POA: Diagnosis not present

## 2022-10-23 DIAGNOSIS — M542 Cervicalgia: Secondary | ICD-10-CM | POA: Diagnosis not present

## 2022-10-23 DIAGNOSIS — M9905 Segmental and somatic dysfunction of pelvic region: Secondary | ICD-10-CM | POA: Diagnosis not present

## 2022-10-23 DIAGNOSIS — Z6835 Body mass index (BMI) 35.0-35.9, adult: Secondary | ICD-10-CM | POA: Diagnosis not present

## 2022-10-23 DIAGNOSIS — M62830 Muscle spasm of back: Secondary | ICD-10-CM | POA: Diagnosis not present

## 2022-10-23 DIAGNOSIS — E1165 Type 2 diabetes mellitus with hyperglycemia: Secondary | ICD-10-CM | POA: Diagnosis not present

## 2022-10-23 DIAGNOSIS — N138 Other obstructive and reflux uropathy: Secondary | ICD-10-CM | POA: Diagnosis not present

## 2022-10-23 DIAGNOSIS — M25562 Pain in left knee: Secondary | ICD-10-CM | POA: Diagnosis not present

## 2022-10-23 DIAGNOSIS — M170 Bilateral primary osteoarthritis of knee: Secondary | ICD-10-CM | POA: Diagnosis not present

## 2022-10-23 DIAGNOSIS — E875 Hyperkalemia: Secondary | ICD-10-CM | POA: Diagnosis not present

## 2022-10-23 DIAGNOSIS — Z6834 Body mass index (BMI) 34.0-34.9, adult: Secondary | ICD-10-CM | POA: Diagnosis not present

## 2022-10-23 DIAGNOSIS — M9901 Segmental and somatic dysfunction of cervical region: Secondary | ICD-10-CM | POA: Diagnosis not present

## 2022-10-23 DIAGNOSIS — E782 Mixed hyperlipidemia: Secondary | ICD-10-CM | POA: Diagnosis not present

## 2022-10-23 DIAGNOSIS — I10 Essential (primary) hypertension: Secondary | ICD-10-CM | POA: Diagnosis not present

## 2022-10-23 DIAGNOSIS — E6609 Other obesity due to excess calories: Secondary | ICD-10-CM | POA: Diagnosis not present

## 2022-10-23 DIAGNOSIS — E1140 Type 2 diabetes mellitus with diabetic neuropathy, unspecified: Secondary | ICD-10-CM | POA: Diagnosis not present

## 2022-10-23 DIAGNOSIS — M546 Pain in thoracic spine: Secondary | ICD-10-CM | POA: Diagnosis not present

## 2022-10-23 DIAGNOSIS — Z85820 Personal history of malignant melanoma of skin: Secondary | ICD-10-CM | POA: Diagnosis not present

## 2022-10-23 DIAGNOSIS — E7849 Other hyperlipidemia: Secondary | ICD-10-CM | POA: Diagnosis not present

## 2022-10-23 DIAGNOSIS — Z23 Encounter for immunization: Secondary | ICD-10-CM | POA: Diagnosis not present

## 2022-10-23 DIAGNOSIS — Z1331 Encounter for screening for depression: Secondary | ICD-10-CM | POA: Diagnosis not present

## 2022-10-23 DIAGNOSIS — Z1389 Encounter for screening for other disorder: Secondary | ICD-10-CM | POA: Diagnosis not present

## 2022-10-23 DIAGNOSIS — M9903 Segmental and somatic dysfunction of lumbar region: Secondary | ICD-10-CM | POA: Diagnosis not present

## 2022-10-23 DIAGNOSIS — M9902 Segmental and somatic dysfunction of thoracic region: Secondary | ICD-10-CM | POA: Diagnosis not present

## 2022-10-23 DIAGNOSIS — N401 Enlarged prostate with lower urinary tract symptoms: Secondary | ICD-10-CM | POA: Diagnosis not present

## 2022-10-28 ENCOUNTER — Telehealth: Payer: Self-pay | Admitting: Orthopaedic Surgery

## 2022-10-28 NOTE — Telephone Encounter (Signed)
Patient's wife called. He is wanting gel injections in his L knee. Call back number is [phone]

## 2022-10-28 NOTE — Telephone Encounter (Signed)
Submitted for VOB for Euflexxa left knee  

## 2022-10-29 NOTE — Telephone Encounter (Signed)
PA has been started through euflexxa portal for left knee. PA pending

## 2022-10-29 NOTE — Telephone Encounter (Signed)
Received VOB. Requires PA. Can you please submit for this

## 2022-11-13 DIAGNOSIS — M25562 Pain in left knee: Secondary | ICD-10-CM | POA: Diagnosis not present

## 2022-11-13 DIAGNOSIS — M9902 Segmental and somatic dysfunction of thoracic region: Secondary | ICD-10-CM | POA: Diagnosis not present

## 2022-11-13 DIAGNOSIS — M9903 Segmental and somatic dysfunction of lumbar region: Secondary | ICD-10-CM | POA: Diagnosis not present

## 2022-11-13 DIAGNOSIS — M542 Cervicalgia: Secondary | ICD-10-CM | POA: Diagnosis not present

## 2022-11-13 DIAGNOSIS — M546 Pain in thoracic spine: Secondary | ICD-10-CM | POA: Diagnosis not present

## 2022-11-13 DIAGNOSIS — M9905 Segmental and somatic dysfunction of pelvic region: Secondary | ICD-10-CM | POA: Diagnosis not present

## 2022-11-13 DIAGNOSIS — M9901 Segmental and somatic dysfunction of cervical region: Secondary | ICD-10-CM | POA: Diagnosis not present

## 2022-11-13 DIAGNOSIS — M62830 Muscle spasm of back: Secondary | ICD-10-CM | POA: Diagnosis not present

## 2022-11-22 DIAGNOSIS — E1140 Type 2 diabetes mellitus with diabetic neuropathy, unspecified: Secondary | ICD-10-CM | POA: Diagnosis not present

## 2022-11-22 DIAGNOSIS — Z6835 Body mass index (BMI) 35.0-35.9, adult: Secondary | ICD-10-CM | POA: Diagnosis not present

## 2022-11-22 DIAGNOSIS — R030 Elevated blood-pressure reading, without diagnosis of hypertension: Secondary | ICD-10-CM | POA: Diagnosis not present

## 2022-11-22 DIAGNOSIS — M1712 Unilateral primary osteoarthritis, left knee: Secondary | ICD-10-CM | POA: Diagnosis not present

## 2022-11-22 DIAGNOSIS — E7849 Other hyperlipidemia: Secondary | ICD-10-CM | POA: Diagnosis not present

## 2022-11-22 DIAGNOSIS — E1165 Type 2 diabetes mellitus with hyperglycemia: Secondary | ICD-10-CM | POA: Diagnosis not present

## 2022-11-26 ENCOUNTER — Other Ambulatory Visit: Payer: Self-pay

## 2022-11-26 DIAGNOSIS — M1712 Unilateral primary osteoarthritis, left knee: Secondary | ICD-10-CM

## 2022-11-28 DIAGNOSIS — M9901 Segmental and somatic dysfunction of cervical region: Secondary | ICD-10-CM | POA: Diagnosis not present

## 2022-11-28 DIAGNOSIS — M25562 Pain in left knee: Secondary | ICD-10-CM | POA: Diagnosis not present

## 2022-11-28 DIAGNOSIS — M9903 Segmental and somatic dysfunction of lumbar region: Secondary | ICD-10-CM | POA: Diagnosis not present

## 2022-11-28 DIAGNOSIS — M546 Pain in thoracic spine: Secondary | ICD-10-CM | POA: Diagnosis not present

## 2022-11-28 DIAGNOSIS — M542 Cervicalgia: Secondary | ICD-10-CM | POA: Diagnosis not present

## 2022-11-28 DIAGNOSIS — M62830 Muscle spasm of back: Secondary | ICD-10-CM | POA: Diagnosis not present

## 2022-11-28 DIAGNOSIS — M9905 Segmental and somatic dysfunction of pelvic region: Secondary | ICD-10-CM | POA: Diagnosis not present

## 2022-11-28 DIAGNOSIS — M9902 Segmental and somatic dysfunction of thoracic region: Secondary | ICD-10-CM | POA: Diagnosis not present

## 2022-12-09 ENCOUNTER — Ambulatory Visit: Payer: Medicare HMO | Admitting: Physician Assistant

## 2022-12-09 ENCOUNTER — Encounter: Payer: Self-pay | Admitting: Physician Assistant

## 2022-12-09 DIAGNOSIS — M1712 Unilateral primary osteoarthritis, left knee: Secondary | ICD-10-CM

## 2022-12-09 MED ORDER — SODIUM HYALURONATE (VISCOSUP) 20 MG/2ML IX SOSY
20.0000 mg | PREFILLED_SYRINGE | INTRA_ARTICULAR | Status: AC | PRN
  Administered 2022-12-09: 20 mg via INTRA_ARTICULAR

## 2022-12-09 NOTE — Progress Notes (Signed)
   Procedure Note  Patient: RAUL HOWALD             Date of Birth: 11-11-1955           MRN: [id number]             Visit Date: 12/09/2022 HPI: Mr. Bussie comes in today for scheduled Euflexxa injection left knee.  He has known moderate tricompartmental arthritis which is failed all conservative measures including cortisone injections.  He has had no new injury to the knee.  Has no scheduled surgery for the left knee next 6 months.  Physical exam: Left knee good range of motion.  No abnormal warmth erythema or effusion.  Procedures: Visit Diagnoses:  1. Unilateral primary osteoarthritis, left knee     Large Joint Inj: L knee on 12/09/2022 9:15 AM Indications: pain Details: 22 G 1.5 in needle, anterolateral approach  Arthrogram: No  Medications: 20 mg Sodium Hyaluronate (Viscosup) 20 MG/2ML Outcome: tolerated well, no immediate complications Procedure, treatment alternatives, risks and benefits explained, specific risks discussed. Consent was given by the patient. Immediately prior to procedure a time out was called to verify the correct patient, procedure, equipment, support staff and site/side marked as required. Patient was prepped and draped in the usual sterile fashion.     Plan: Will see him back in 1 week for his second of 3 Euflexxa injections.  Questions were encouraged and answered at length today.

## 2022-12-11 DIAGNOSIS — M9905 Segmental and somatic dysfunction of pelvic region: Secondary | ICD-10-CM | POA: Diagnosis not present

## 2022-12-11 DIAGNOSIS — M62830 Muscle spasm of back: Secondary | ICD-10-CM | POA: Diagnosis not present

## 2022-12-11 DIAGNOSIS — M25562 Pain in left knee: Secondary | ICD-10-CM | POA: Diagnosis not present

## 2022-12-11 DIAGNOSIS — M9903 Segmental and somatic dysfunction of lumbar region: Secondary | ICD-10-CM | POA: Diagnosis not present

## 2022-12-11 DIAGNOSIS — M9902 Segmental and somatic dysfunction of thoracic region: Secondary | ICD-10-CM | POA: Diagnosis not present

## 2022-12-11 DIAGNOSIS — M542 Cervicalgia: Secondary | ICD-10-CM | POA: Diagnosis not present

## 2022-12-11 DIAGNOSIS — M546 Pain in thoracic spine: Secondary | ICD-10-CM | POA: Diagnosis not present

## 2022-12-11 DIAGNOSIS — M9901 Segmental and somatic dysfunction of cervical region: Secondary | ICD-10-CM | POA: Diagnosis not present

## 2022-12-16 ENCOUNTER — Ambulatory Visit: Payer: Medicare HMO | Admitting: Physician Assistant

## 2022-12-16 ENCOUNTER — Encounter: Payer: Self-pay | Admitting: Physician Assistant

## 2022-12-16 DIAGNOSIS — M1712 Unilateral primary osteoarthritis, left knee: Secondary | ICD-10-CM

## 2022-12-16 MED ORDER — SODIUM HYALURONATE (VISCOSUP) 20 MG/2ML IX SOSY
20.0000 mg | PREFILLED_SYRINGE | INTRA_ARTICULAR | Status: AC | PRN
  Administered 2022-12-16: 20 mg via INTRA_ARTICULAR

## 2022-12-16 MED ORDER — HYALURONAN 88 MG/4ML IX SOSY
88.0000 mg | PREFILLED_SYRINGE | INTRA_ARTICULAR | Status: AC | PRN
  Administered 2022-12-16: 88 mg via INTRA_ARTICULAR

## 2022-12-16 NOTE — Progress Notes (Signed)
   Procedure Note  Patient: CAELAN MORIARITY             Date of Birth: [birth year]           MRN: [id number]             Visit Date: 12/16/2022 HPI: Mr. Julian Reil returns today for his second Euflexxa injection left knee.  He has known osteoarthritis left knee.  He has no upcoming surgery on the left knee in the next 6 months.  He states he had no adverse effects to the last injection.  He has felt no relief from the knee pain since the first injection.  Physical exam: Left knee good range of motion.  No abnormal warmth erythema or effusion. Procedures: Visit Diagnoses:  1. Unilateral primary osteoarthritis, left knee     Large Joint Inj: L knee on 12/16/2022 9:15 AM Indications: pain Details: 22 G 1.5 in needle, anterolateral approach  Arthrogram: No  Medications: 88 mg Hyaluronan 88 MG/4ML; 20 mg Sodium Hyaluronate (Viscosup) 20 MG/2ML Outcome: tolerated well, no immediate complications Procedure, treatment alternatives, risks and benefits explained, specific risks discussed. Consent was given by the patient. Immediately prior to procedure a time out was called to verify the correct patient, procedure, equipment, support staff and site/side marked as required. Patient was prepped and draped in the usual sterile fashion.     Plan: Will see him back in 1 week for his third Euflexxa injection left knee.  Questions were encouraged and answered.

## 2022-12-24 ENCOUNTER — Encounter: Payer: Self-pay | Admitting: Orthopaedic Surgery

## 2022-12-24 ENCOUNTER — Ambulatory Visit: Payer: Medicare HMO | Admitting: Orthopaedic Surgery

## 2022-12-24 DIAGNOSIS — M1712 Unilateral primary osteoarthritis, left knee: Secondary | ICD-10-CM | POA: Diagnosis not present

## 2022-12-24 MED ORDER — SODIUM HYALURONATE (VISCOSUP) 20 MG/2ML IX SOSY
20.0000 mg | PREFILLED_SYRINGE | INTRA_ARTICULAR | Status: AC | PRN
Start: 2022-12-24 — End: 2022-12-24
  Administered 2022-12-24: 20 mg via INTRA_ARTICULAR

## 2022-12-24 MED ORDER — LIDOCAINE HCL 1 % IJ SOLN
3.0000 mL | INTRAMUSCULAR | Status: AC | PRN
Start: 2022-12-24 — End: 2022-12-24
  Administered 2022-12-24: 3 mL

## 2022-12-24 NOTE — Progress Notes (Signed)
   Procedure Note  Patient: Luis Fitzgerald             Date of Birth: 03/26/1956           MRN: [id number]             Visit Date: 12/24/2022  Procedures: Visit Diagnoses:  1. Unilateral primary osteoarthritis, left knee     Large Joint Inj on 12/24/2022 9:14 AM Indications: diagnostic evaluation and pain Details: 22 G 1.5 in needle, superolateral approach  Arthrogram: No  Medications: 3 mL lidocaine 1 %; 20 mg Sodium Hyaluronate (Viscosup) 20 MG/2ML Outcome: tolerated well, no immediate complications Procedure, treatment alternatives, risks and benefits explained, specific risks discussed. Consent was given by the patient. Immediately prior to procedure a time out was called to verify the correct patient, procedure, equipment, support staff and site/side marked as required. Patient was prepped and draped in the usual sterile fashion.     The patient comes in today for injection #3 of a series of 3 hyaluronic acid injections with Euflexxa in his left knee to treat the pain from osteoarthritis.  He said no adverse reactions to the other 2 injections.  He does have significant osteoarthritis of his left knee.  There is no knee joint effusion today with the left knee.  He has slight varus malalignment with good range of motion.  I did place injection #3 of Euflexxa in his left knee.  We can see him back in 3 months to see how he is doing overall.  Lot number: [id number] Expiration date: 11/01/2023

## 2023-01-02 DIAGNOSIS — Z85828 Personal history of other malignant neoplasm of skin: Secondary | ICD-10-CM | POA: Diagnosis not present

## 2023-01-02 DIAGNOSIS — Z08 Encounter for follow-up examination after completed treatment for malignant neoplasm: Secondary | ICD-10-CM | POA: Diagnosis not present

## 2023-01-15 DIAGNOSIS — M9901 Segmental and somatic dysfunction of cervical region: Secondary | ICD-10-CM | POA: Diagnosis not present

## 2023-01-15 DIAGNOSIS — M542 Cervicalgia: Secondary | ICD-10-CM | POA: Diagnosis not present

## 2023-01-15 DIAGNOSIS — M62830 Muscle spasm of back: Secondary | ICD-10-CM | POA: Diagnosis not present

## 2023-01-15 DIAGNOSIS — M9902 Segmental and somatic dysfunction of thoracic region: Secondary | ICD-10-CM | POA: Diagnosis not present

## 2023-01-15 DIAGNOSIS — M9903 Segmental and somatic dysfunction of lumbar region: Secondary | ICD-10-CM | POA: Diagnosis not present

## 2023-01-15 DIAGNOSIS — M25562 Pain in left knee: Secondary | ICD-10-CM | POA: Diagnosis not present

## 2023-01-15 DIAGNOSIS — M546 Pain in thoracic spine: Secondary | ICD-10-CM | POA: Diagnosis not present

## 2023-01-15 DIAGNOSIS — M9905 Segmental and somatic dysfunction of pelvic region: Secondary | ICD-10-CM | POA: Diagnosis not present

## 2023-02-12 DIAGNOSIS — M25562 Pain in left knee: Secondary | ICD-10-CM | POA: Diagnosis not present

## 2023-02-12 DIAGNOSIS — M9901 Segmental and somatic dysfunction of cervical region: Secondary | ICD-10-CM | POA: Diagnosis not present

## 2023-02-12 DIAGNOSIS — M62830 Muscle spasm of back: Secondary | ICD-10-CM | POA: Diagnosis not present

## 2023-02-12 DIAGNOSIS — M9905 Segmental and somatic dysfunction of pelvic region: Secondary | ICD-10-CM | POA: Diagnosis not present

## 2023-02-12 DIAGNOSIS — M9903 Segmental and somatic dysfunction of lumbar region: Secondary | ICD-10-CM | POA: Diagnosis not present

## 2023-02-12 DIAGNOSIS — M546 Pain in thoracic spine: Secondary | ICD-10-CM | POA: Diagnosis not present

## 2023-02-12 DIAGNOSIS — M9902 Segmental and somatic dysfunction of thoracic region: Secondary | ICD-10-CM | POA: Diagnosis not present

## 2023-02-12 DIAGNOSIS — M542 Cervicalgia: Secondary | ICD-10-CM | POA: Diagnosis not present

## 2023-02-21 DIAGNOSIS — I10 Essential (primary) hypertension: Secondary | ICD-10-CM | POA: Diagnosis not present

## 2023-02-21 DIAGNOSIS — E1165 Type 2 diabetes mellitus with hyperglycemia: Secondary | ICD-10-CM | POA: Diagnosis not present

## 2023-02-21 DIAGNOSIS — E1140 Type 2 diabetes mellitus with diabetic neuropathy, unspecified: Secondary | ICD-10-CM | POA: Diagnosis not present

## 2023-02-21 DIAGNOSIS — E7849 Other hyperlipidemia: Secondary | ICD-10-CM | POA: Diagnosis not present

## 2023-02-21 DIAGNOSIS — E7801 Familial hypercholesterolemia: Secondary | ICD-10-CM | POA: Diagnosis not present

## 2023-03-05 DIAGNOSIS — Z85820 Personal history of malignant melanoma of skin: Secondary | ICD-10-CM | POA: Diagnosis not present

## 2023-03-05 DIAGNOSIS — E1140 Type 2 diabetes mellitus with diabetic neuropathy, unspecified: Secondary | ICD-10-CM | POA: Diagnosis not present

## 2023-03-05 DIAGNOSIS — E6609 Other obesity due to excess calories: Secondary | ICD-10-CM | POA: Diagnosis not present

## 2023-03-05 DIAGNOSIS — N138 Other obstructive and reflux uropathy: Secondary | ICD-10-CM | POA: Diagnosis not present

## 2023-03-05 DIAGNOSIS — M170 Bilateral primary osteoarthritis of knee: Secondary | ICD-10-CM | POA: Diagnosis not present

## 2023-03-05 DIAGNOSIS — E875 Hyperkalemia: Secondary | ICD-10-CM | POA: Diagnosis not present

## 2023-03-05 DIAGNOSIS — Z6834 Body mass index (BMI) 34.0-34.9, adult: Secondary | ICD-10-CM | POA: Diagnosis not present

## 2023-03-05 DIAGNOSIS — I10 Essential (primary) hypertension: Secondary | ICD-10-CM | POA: Diagnosis not present

## 2023-03-05 DIAGNOSIS — E1165 Type 2 diabetes mellitus with hyperglycemia: Secondary | ICD-10-CM | POA: Diagnosis not present

## 2023-03-05 DIAGNOSIS — E7849 Other hyperlipidemia: Secondary | ICD-10-CM | POA: Diagnosis not present

## 2023-03-05 DIAGNOSIS — N401 Enlarged prostate with lower urinary tract symptoms: Secondary | ICD-10-CM | POA: Diagnosis not present

## 2023-03-19 DIAGNOSIS — M546 Pain in thoracic spine: Secondary | ICD-10-CM | POA: Diagnosis not present

## 2023-03-19 DIAGNOSIS — M9903 Segmental and somatic dysfunction of lumbar region: Secondary | ICD-10-CM | POA: Diagnosis not present

## 2023-03-19 DIAGNOSIS — M25562 Pain in left knee: Secondary | ICD-10-CM | POA: Diagnosis not present

## 2023-03-19 DIAGNOSIS — M9905 Segmental and somatic dysfunction of pelvic region: Secondary | ICD-10-CM | POA: Diagnosis not present

## 2023-03-19 DIAGNOSIS — M542 Cervicalgia: Secondary | ICD-10-CM | POA: Diagnosis not present

## 2023-03-19 DIAGNOSIS — M9901 Segmental and somatic dysfunction of cervical region: Secondary | ICD-10-CM | POA: Diagnosis not present

## 2023-03-19 DIAGNOSIS — M62830 Muscle spasm of back: Secondary | ICD-10-CM | POA: Diagnosis not present

## 2023-03-19 DIAGNOSIS — M9902 Segmental and somatic dysfunction of thoracic region: Secondary | ICD-10-CM | POA: Diagnosis not present

## 2023-03-26 ENCOUNTER — Encounter: Payer: Self-pay | Admitting: Orthopaedic Surgery

## 2023-03-26 ENCOUNTER — Ambulatory Visit: Payer: Medicare HMO | Admitting: Orthopaedic Surgery

## 2023-03-26 DIAGNOSIS — M216X2 Other acquired deformities of left foot: Secondary | ICD-10-CM

## 2023-03-26 DIAGNOSIS — M1712 Unilateral primary osteoarthritis, left knee: Secondary | ICD-10-CM | POA: Diagnosis not present

## 2023-03-26 NOTE — Progress Notes (Signed)
HPI: [name] returns today 3 months status post left knee viscosupplementation injections.  He states that the knee is overall doing much better.  He still has occasional stiffness and some discomfort in the posterior aspect of the knee but states he is more than 75% improved. He does ask about his left ankle he feels like he is walking on the lateral aspect of his foot.  He is asking for a prescription for an orthotic as he had an orthotic in the past for the left foot however his dog chewed the orthotics up.  Since then he feels that the deformity is becoming worse.  He notes that he has history of ankle injury and states that he had an ankle fracture.  Notes he has no real pain in the left ankle.  Review of systems see HPI otherwise negative  Physical exam: General Well-developed well-nourished male no acute distress mood and affect appropriate Left knee: Good range of motion.  No abnormal warmth erythema or effusion. Left foot/ankle: Full dorsiflexion plantarflexion left ankle without pain.  He is actively able to invert and evert foot.  With inversion eversion against resistance he has 5 out of 5 strength.  There is no impending ulcers rashes skin lesions of the left foot.  Cavovarus deformity left foot with weightbearing.  Impression: Acquired cavovarus deformity left foot Left knee osteoarthritis  Plan: He understands that he needs to wait least 6 months between viscosupplementation injections.  In regards to his cavovarus deformity as he has had an orthotic in the past and states that he did well we will send him back to [facility] for a custom insert with a lateral hindfoot wedge.  He will follow-up with [place] as needed pain persist or becomes worse.

## 2023-04-16 DIAGNOSIS — M9901 Segmental and somatic dysfunction of cervical region: Secondary | ICD-10-CM | POA: Diagnosis not present

## 2023-04-16 DIAGNOSIS — M25562 Pain in left knee: Secondary | ICD-10-CM | POA: Diagnosis not present

## 2023-04-16 DIAGNOSIS — M9905 Segmental and somatic dysfunction of pelvic region: Secondary | ICD-10-CM | POA: Diagnosis not present

## 2023-04-16 DIAGNOSIS — M9903 Segmental and somatic dysfunction of lumbar region: Secondary | ICD-10-CM | POA: Diagnosis not present

## 2023-04-16 DIAGNOSIS — M542 Cervicalgia: Secondary | ICD-10-CM | POA: Diagnosis not present

## 2023-04-16 DIAGNOSIS — M9902 Segmental and somatic dysfunction of thoracic region: Secondary | ICD-10-CM | POA: Diagnosis not present

## 2023-04-16 DIAGNOSIS — M546 Pain in thoracic spine: Secondary | ICD-10-CM | POA: Diagnosis not present

## 2023-04-16 DIAGNOSIS — M62830 Muscle spasm of back: Secondary | ICD-10-CM | POA: Diagnosis not present

## 2023-05-14 DIAGNOSIS — M9903 Segmental and somatic dysfunction of lumbar region: Secondary | ICD-10-CM | POA: Diagnosis not present

## 2023-05-14 DIAGNOSIS — M9902 Segmental and somatic dysfunction of thoracic region: Secondary | ICD-10-CM | POA: Diagnosis not present

## 2023-05-14 DIAGNOSIS — M25562 Pain in left knee: Secondary | ICD-10-CM | POA: Diagnosis not present

## 2023-05-14 DIAGNOSIS — M9901 Segmental and somatic dysfunction of cervical region: Secondary | ICD-10-CM | POA: Diagnosis not present

## 2023-05-14 DIAGNOSIS — M62830 Muscle spasm of back: Secondary | ICD-10-CM | POA: Diagnosis not present

## 2023-05-14 DIAGNOSIS — M546 Pain in thoracic spine: Secondary | ICD-10-CM | POA: Diagnosis not present

## 2023-05-14 DIAGNOSIS — M542 Cervicalgia: Secondary | ICD-10-CM | POA: Diagnosis not present

## 2023-05-14 DIAGNOSIS — M9905 Segmental and somatic dysfunction of pelvic region: Secondary | ICD-10-CM | POA: Diagnosis not present

## 2023-05-19 ENCOUNTER — Ambulatory Visit (INDEPENDENT_AMBULATORY_CARE_PROVIDER_SITE_OTHER): Payer: Medicare HMO | Admitting: Orthopaedic Surgery

## 2023-05-19 ENCOUNTER — Encounter: Payer: Self-pay | Admitting: Orthopaedic Surgery

## 2023-05-19 ENCOUNTER — Telehealth: Payer: Self-pay

## 2023-05-19 VITALS — Wt 228.0 lb

## 2023-05-19 DIAGNOSIS — M1712 Unilateral primary osteoarthritis, left knee: Secondary | ICD-10-CM | POA: Diagnosis not present

## 2023-05-19 DIAGNOSIS — M25562 Pain in left knee: Secondary | ICD-10-CM

## 2023-05-19 DIAGNOSIS — G8929 Other chronic pain: Secondary | ICD-10-CM

## 2023-05-19 MED ORDER — METHYLPREDNISOLONE ACETATE 40 MG/ML IJ SUSP
40.0000 mg | INTRAMUSCULAR | Status: AC | PRN
Start: 2023-05-19 — End: 2023-05-19
  Administered 2023-05-19: 40 mg via INTRA_ARTICULAR

## 2023-05-19 MED ORDER — LIDOCAINE HCL 1 % IJ SOLN
3.0000 mL | INTRAMUSCULAR | Status: AC | PRN
Start: 2023-05-19 — End: 2023-05-19
  Administered 2023-05-19: 3 mL

## 2023-05-19 NOTE — Telephone Encounter (Signed)
Left knee gel injection ?

## 2023-05-19 NOTE — Progress Notes (Signed)
The patient is a 67 year old gentleman with known arthritis of his left knee.  About 3 weeks ago he had a really big flareup of swelling and pain in the knee.  He is diabetic but under excellent control.  We last placed hyaluronic in the left knee 5 months ago.  He would like to have that considered again.  He is not ready for knee replacement surgery.  He said he is feeling better since the time he made that appointment.  3 weeks ago he was significantly more painful.  Today he has shows no swelling over the left knee.  He does have pain throughout the arc of motion of the knee but overall he looks good and he feels better.  He is the perfect candidate for hyaluronic acid again but we can order that once it has been 6 months which will be after November 28.  In the interim we did place a steroid injection in his left knee today.  He will watch his blood glucose closely.  This patient is diagnosed with osteoarthritis of the knee(s).    Radiographs show evidence of joint space narrowing, osteophytes, subchondral sclerosis and/or subchondral cysts.  This patient has knee pain which interferes with functional and activities of daily living.    This patient has experienced inadequate response, adverse effects and/or intolerance with conservative treatments such as acetaminophen, NSAIDS, topical creams, physical therapy or regular exercise, knee bracing and/or weight loss.   This patient has experienced inadequate response or has a contraindication to intra articular steroid injections for at least 3 months.   This patient is not scheduled to have a total knee replacement within 6 months of starting treatment with viscosupplementation.     Procedure Note  Patient: Luis Fitzgerald             Date of Birth: [birth year]           MRN: [id number]             Visit Date: 05/19/2023  Procedures: Visit Diagnoses:  1. Unilateral primary osteoarthritis, left knee   2. Chronic pain of left knee      Large Joint Inj: L knee on 05/19/2023 10:01 AM Indications: diagnostic evaluation and pain Details: 22 G 1.5 in needle, superolateral approach  Arthrogram: No  Medications: 3 mL lidocaine 1 %; 40 mg methylPREDNISolone acetate 40 MG/ML Outcome: tolerated well, no immediate complications Procedure, treatment alternatives, risks and benefits explained, specific risks discussed. Consent was given by the patient. Immediately prior to procedure a time out was called to verify the correct patient, procedure, equipment, support staff and site/side marked as required. Patient was prepped and draped in the usual sterile fashion.

## 2023-05-21 NOTE — Telephone Encounter (Signed)
VOB submitted for Euflexxa, left knee  Next available gel injection appointment needs to be after 06/26/2023.

## 2023-05-26 ENCOUNTER — Ambulatory Visit: Payer: Medicare HMO | Admitting: Orthopaedic Surgery

## 2023-06-18 DIAGNOSIS — M9902 Segmental and somatic dysfunction of thoracic region: Secondary | ICD-10-CM | POA: Diagnosis not present

## 2023-06-18 DIAGNOSIS — M9903 Segmental and somatic dysfunction of lumbar region: Secondary | ICD-10-CM | POA: Diagnosis not present

## 2023-06-18 DIAGNOSIS — M9901 Segmental and somatic dysfunction of cervical region: Secondary | ICD-10-CM | POA: Diagnosis not present

## 2023-06-18 DIAGNOSIS — M9905 Segmental and somatic dysfunction of pelvic region: Secondary | ICD-10-CM | POA: Diagnosis not present

## 2023-06-18 DIAGNOSIS — M546 Pain in thoracic spine: Secondary | ICD-10-CM | POA: Diagnosis not present

## 2023-06-18 DIAGNOSIS — M25562 Pain in left knee: Secondary | ICD-10-CM | POA: Diagnosis not present

## 2023-06-18 DIAGNOSIS — M542 Cervicalgia: Secondary | ICD-10-CM | POA: Diagnosis not present

## 2023-06-18 DIAGNOSIS — M62830 Muscle spasm of back: Secondary | ICD-10-CM | POA: Diagnosis not present

## 2023-07-01 ENCOUNTER — Telehealth: Payer: Self-pay | Admitting: Orthopaedic Surgery

## 2023-07-01 DIAGNOSIS — E1165 Type 2 diabetes mellitus with hyperglycemia: Secondary | ICD-10-CM | POA: Diagnosis not present

## 2023-07-01 DIAGNOSIS — Z6833 Body mass index (BMI) 33.0-33.9, adult: Secondary | ICD-10-CM | POA: Diagnosis not present

## 2023-07-01 DIAGNOSIS — Z85820 Personal history of malignant melanoma of skin: Secondary | ICD-10-CM | POA: Diagnosis not present

## 2023-07-01 DIAGNOSIS — M170 Bilateral primary osteoarthritis of knee: Secondary | ICD-10-CM | POA: Diagnosis not present

## 2023-07-01 DIAGNOSIS — E875 Hyperkalemia: Secondary | ICD-10-CM | POA: Diagnosis not present

## 2023-07-01 DIAGNOSIS — N401 Enlarged prostate with lower urinary tract symptoms: Secondary | ICD-10-CM | POA: Diagnosis not present

## 2023-07-01 DIAGNOSIS — E6609 Other obesity due to excess calories: Secondary | ICD-10-CM | POA: Diagnosis not present

## 2023-07-01 DIAGNOSIS — Z23 Encounter for immunization: Secondary | ICD-10-CM | POA: Diagnosis not present

## 2023-07-01 DIAGNOSIS — E1140 Type 2 diabetes mellitus with diabetic neuropathy, unspecified: Secondary | ICD-10-CM | POA: Diagnosis not present

## 2023-07-01 DIAGNOSIS — N138 Other obstructive and reflux uropathy: Secondary | ICD-10-CM | POA: Diagnosis not present

## 2023-07-01 DIAGNOSIS — E7849 Other hyperlipidemia: Secondary | ICD-10-CM | POA: Diagnosis not present

## 2023-07-01 DIAGNOSIS — I10 Essential (primary) hypertension: Secondary | ICD-10-CM | POA: Diagnosis not present

## 2023-07-01 NOTE — Telephone Encounter (Signed)
Talked with patients wife and appointments have been scheduled for gel injection.

## 2023-07-01 NOTE — Telephone Encounter (Signed)
Pt's wife Zella Ball called about gel injection approval. Please call pt's wife to schedule for injection. Phone number is [phone].

## 2023-07-02 ENCOUNTER — Other Ambulatory Visit: Payer: Self-pay

## 2023-07-02 DIAGNOSIS — M1712 Unilateral primary osteoarthritis, left knee: Secondary | ICD-10-CM

## 2023-07-14 ENCOUNTER — Ambulatory Visit: Payer: Medicare HMO | Admitting: Physician Assistant

## 2023-07-14 ENCOUNTER — Encounter: Payer: Self-pay | Admitting: Physician Assistant

## 2023-07-14 DIAGNOSIS — M1712 Unilateral primary osteoarthritis, left knee: Secondary | ICD-10-CM | POA: Diagnosis not present

## 2023-07-14 MED ORDER — SODIUM HYALURONATE (VISCOSUP) 20 MG/2ML IX SOSY
20.0000 mg | PREFILLED_SYRINGE | INTRA_ARTICULAR | Status: AC | PRN
Start: 2023-07-14 — End: 2023-07-14
  Administered 2023-07-14: 20 mg via INTRA_ARTICULAR

## 2023-07-14 NOTE — Progress Notes (Signed)
   Procedure Note  Patient: Luis Fitzgerald             Date of Birth: 1956-05-10           MRN: [id number]             Visit Date: 07/14/2023  HPI:  Mr. Insixiengmay is here for today for his first Euflexxa injection.  He has known osteoarthritis of his knee.  He has no upcoming surgery in the next 6 months.  He has tried previous cortisone injections and other conservative measures without relief.  Viscosupplementation injections have given him good relief in the past.  Physical exam: Left knee: Good range of motion.  No abnormal warmth erythema or effusion.  Procedures: Visit Diagnoses:  1. Unilateral primary osteoarthritis, left knee     Large Joint Inj: L knee on 07/14/2023 4:54 PM Indications: pain Details: 22 G 1.5 in needle, anterolateral approach  Arthrogram: No  Medications: 20 mg Sodium Hyaluronate (Viscosup) 20 MG/2ML Outcome: tolerated well, no immediate complications Procedure, treatment alternatives, risks and benefits explained, specific risks discussed. Consent was given by the patient. Immediately prior to procedure a time out was called to verify the correct patient, procedure, equipment, support staff and site/side marked as required. Patient was prepped and draped in the usual sterile fashion.     Plan: Will see him back in 1 week for his second Euflexxa injection.  He did ask about possible cortisone injection in his right knee which is starting to bother him.  Previous radiographs AP view that was obtained with left knee x-rays were performed shows moderate to moderate severe medial lateral compartmental narrowing.  He continues to have pain and next week in the right knee we will inject his knee at that time.  Questions were encouraged and answered at length

## 2023-07-16 DIAGNOSIS — M62830 Muscle spasm of back: Secondary | ICD-10-CM | POA: Diagnosis not present

## 2023-07-16 DIAGNOSIS — M546 Pain in thoracic spine: Secondary | ICD-10-CM | POA: Diagnosis not present

## 2023-07-16 DIAGNOSIS — L02229 Furuncle of trunk, unspecified: Secondary | ICD-10-CM | POA: Diagnosis not present

## 2023-07-16 DIAGNOSIS — M9902 Segmental and somatic dysfunction of thoracic region: Secondary | ICD-10-CM | POA: Diagnosis not present

## 2023-07-16 DIAGNOSIS — M25562 Pain in left knee: Secondary | ICD-10-CM | POA: Diagnosis not present

## 2023-07-16 DIAGNOSIS — M542 Cervicalgia: Secondary | ICD-10-CM | POA: Diagnosis not present

## 2023-07-16 DIAGNOSIS — B9689 Other specified bacterial agents as the cause of diseases classified elsewhere: Secondary | ICD-10-CM | POA: Diagnosis not present

## 2023-07-16 DIAGNOSIS — M9905 Segmental and somatic dysfunction of pelvic region: Secondary | ICD-10-CM | POA: Diagnosis not present

## 2023-07-16 DIAGNOSIS — M9901 Segmental and somatic dysfunction of cervical region: Secondary | ICD-10-CM | POA: Diagnosis not present

## 2023-07-16 DIAGNOSIS — M9903 Segmental and somatic dysfunction of lumbar region: Secondary | ICD-10-CM | POA: Diagnosis not present

## 2023-07-21 ENCOUNTER — Ambulatory Visit: Payer: Medicare HMO | Admitting: Physician Assistant

## 2023-07-21 ENCOUNTER — Encounter: Payer: Self-pay | Admitting: Physician Assistant

## 2023-07-21 DIAGNOSIS — M1711 Unilateral primary osteoarthritis, right knee: Secondary | ICD-10-CM

## 2023-07-21 DIAGNOSIS — M1712 Unilateral primary osteoarthritis, left knee: Secondary | ICD-10-CM | POA: Diagnosis not present

## 2023-07-21 MED ORDER — SODIUM HYALURONATE (VISCOSUP) 20 MG/2ML IX SOSY
20.0000 mg | PREFILLED_SYRINGE | INTRA_ARTICULAR | Status: AC | PRN
Start: 2023-07-21 — End: 2023-07-21
  Administered 2023-07-21: 20 mg via INTRA_ARTICULAR

## 2023-07-21 MED ORDER — LIDOCAINE HCL 1 % IJ SOLN
3.0000 mL | INTRAMUSCULAR | Status: AC | PRN
  Administered 2023-07-21: 3 mL

## 2023-07-21 MED ORDER — METHYLPREDNISOLONE ACETATE 40 MG/ML IJ SUSP
40.0000 mg | INTRAMUSCULAR | Status: AC | PRN
  Administered 2023-07-21: 40 mg via INTRA_ARTICULAR

## 2023-07-21 NOTE — Progress Notes (Signed)
                                                                                                                                                                                                                                                                                                                                                                                                                                                                                                                                                                                              00000000[id number]00000000000000000000000000000000000000000000000000000000000000000000000000000000000000000000000000000000000000000000000000000000000000000000000000000000000000000000000000000000000000000000000000000000000000000000000000000000000000000000000000000000000000000000000000000000000000000000000000000000 0000000000000000000000000000000000000000[id number]++++++++++++[id number]+                                                                                                                                                                                                                                                                                                                                                                                                                                                                                                                                                Procedure Note  Patient: Luis Fitzgerald             Date of Birth: 07-Jul-1956           MRN: [id number]             Visit Date: 07/21/2023  HPI: [name] comes in today for his second Euflexxa injection left knee.  He is also requesting a right knee steroid injection he has known arthritis of both knees.  He states the first Euflexxa injection has given him some relief he did have some discomfort from it and felt odd to him a couple of days but it is trending towards improvement.  Denies any fevers chills.  Physical exam: Bilateral knees good range of motion no abnormal warmth erythema or effusion Procedures: Visit Diagnoses:  1. Unilateral primary osteoarthritis, left knee   2. Primary osteoarthritis of right knee     Large Joint Inj: bilateral knee on  07/21/2023 11:40 AM Indications: pain Details: 22 G 1.5 in needle, anterolateral approach  Arthrogram: No  Medications (Right): 20 mg Sodium Hyaluronate (Viscosup) 20 MG/2ML Medications (Left): 40 mg methylPREDNISolone acetate 40 MG/ML; 3 mL lidocaine 1 % Outcome: tolerated well, no immediate complications Procedure, treatment alternatives, risks and benefits explained, specific risks discussed. Consent was given by the patient. Immediately prior to procedure a time out was called to verify the correct patient, procedure, equipment, support staff and site/side marked as required. Patient was prepped and draped in the usual sterile fashion.    Plan: He will follow-up with [place] in 1 week for his third Euflexxa injection left knee.  Questions were encouraged and answered at length

## 2023-07-28 ENCOUNTER — Encounter: Payer: Self-pay | Admitting: Physician Assistant

## 2023-07-28 ENCOUNTER — Ambulatory Visit: Payer: Medicare HMO | Admitting: Physician Assistant

## 2023-07-28 DIAGNOSIS — M1712 Unilateral primary osteoarthritis, left knee: Secondary | ICD-10-CM | POA: Diagnosis not present

## 2023-07-28 MED ORDER — SODIUM HYALURONATE (VISCOSUP) 20 MG/2ML IX SOSY
20.0000 mg | PREFILLED_SYRINGE | INTRA_ARTICULAR | Status: AC | PRN
  Administered 2023-07-28: 20 mg via INTRA_ARTICULAR

## 2023-07-28 NOTE — Progress Notes (Signed)
   Procedure Note  Patient: Luis Fitzgerald             Date of Birth: 1956-02-20           MRN: [id number]             Visit Date: 07/28/2023 HPI: Mr. Julian Reil comes in today for his third Euflexxa injection.  He is not sure if it the injection is helping at this point.  He has had no adverse effects.  Again he has known osteoarthritis left knee.    Physical exam: General Well-developed well-nourished male no acute distress.  Mood and affect appropriate Left knee: Good range of motion no abnormal warmth erythema or effusion Procedures: Visit Diagnoses:  1. Unilateral primary osteoarthritis, left knee     Large Joint Inj: L knee on 07/28/2023 12:16 PM Indications: pain Details: 22 G 1.5 in needle, anterolateral approach  Arthrogram: No  Medications: 20 mg Sodium Hyaluronate (Viscosup) 20 MG/2ML Outcome: tolerated well, no immediate complications Procedure, treatment alternatives, risks and benefits explained, specific risks discussed. Consent was given by the patient. Immediately prior to procedure a time out was called to verify the correct patient, procedure, equipment, support staff and site/side marked as required. Patient was prepped and draped in the usual sterile fashion.     Plan: He will follow-up with [place] as needed he knows to wait least 6 months between visco supplementation and  3 months between steroid injections.  Of note right knee is doing well status post cortisone injection.

## 2023-08-11 DIAGNOSIS — X32XXXD Exposure to sunlight, subsequent encounter: Secondary | ICD-10-CM | POA: Diagnosis not present

## 2023-08-11 DIAGNOSIS — L570 Actinic keratosis: Secondary | ICD-10-CM | POA: Diagnosis not present

## 2023-08-11 DIAGNOSIS — Z1283 Encounter for screening for malignant neoplasm of skin: Secondary | ICD-10-CM | POA: Diagnosis not present

## 2023-08-11 DIAGNOSIS — D225 Melanocytic nevi of trunk: Secondary | ICD-10-CM | POA: Diagnosis not present

## 2023-08-11 DIAGNOSIS — Z85820 Personal history of malignant melanoma of skin: Secondary | ICD-10-CM | POA: Diagnosis not present

## 2023-08-11 DIAGNOSIS — Z08 Encounter for follow-up examination after completed treatment for malignant neoplasm: Secondary | ICD-10-CM | POA: Diagnosis not present

## 2023-08-11 DIAGNOSIS — L820 Inflamed seborrheic keratosis: Secondary | ICD-10-CM | POA: Diagnosis not present

## 2023-08-13 DIAGNOSIS — M9903 Segmental and somatic dysfunction of lumbar region: Secondary | ICD-10-CM | POA: Diagnosis not present

## 2023-08-13 DIAGNOSIS — M62830 Muscle spasm of back: Secondary | ICD-10-CM | POA: Diagnosis not present

## 2023-08-13 DIAGNOSIS — M9901 Segmental and somatic dysfunction of cervical region: Secondary | ICD-10-CM | POA: Diagnosis not present

## 2023-08-13 DIAGNOSIS — M546 Pain in thoracic spine: Secondary | ICD-10-CM | POA: Diagnosis not present

## 2023-08-13 DIAGNOSIS — M542 Cervicalgia: Secondary | ICD-10-CM | POA: Diagnosis not present

## 2023-08-13 DIAGNOSIS — M9905 Segmental and somatic dysfunction of pelvic region: Secondary | ICD-10-CM | POA: Diagnosis not present

## 2023-08-13 DIAGNOSIS — M25562 Pain in left knee: Secondary | ICD-10-CM | POA: Diagnosis not present

## 2023-08-13 DIAGNOSIS — M9902 Segmental and somatic dysfunction of thoracic region: Secondary | ICD-10-CM | POA: Diagnosis not present

## 2023-09-01 ENCOUNTER — Ambulatory Visit: Payer: Medicare Other | Admitting: Physician Assistant

## 2023-09-01 DIAGNOSIS — M1712 Unilateral primary osteoarthritis, left knee: Secondary | ICD-10-CM

## 2023-09-01 NOTE — Progress Notes (Signed)
HPI: [name] returns today follow-up left knee.  He states that the viscosupplementation injection on 07/28/2023 was helpful at first but over a week ago he felt a pop in the knee and knees giving way now.  He continues to pop.  He states the knee pain is becoming worse.  He states every time he pivots he has pain in the knee.  Notes swelling.  He has stiffness whenever he first gets up to walk after sitting for any time.  He continues to take Celebrex.  Has had prior viscosupplementation injections that were helpful.  He states that the pain in his knee is interfering with his quality of life to the point that he can no longer bowl or play golf because he cannot trust the knee.  Denies any injury to the knee.  Review of systems: Denies any fevers chills, chest pain shortness of breath or ongoing infections.  Physical exam: General Well-developed well-nourished male no acute distress ambulates without any assistive device.  Positive antalgic gait.  Psych: Alert and oriented x 3  Respirations: Unlabored  Left knee good range of motion.  Slight effusion no abnormal warmth.  No instability with valgus varus stressing.  Patellofemoral crepitus with range of motion.  Slight tenderness medial joint line.   Impression: Left knee tricompartmental arthritis  Plan: Given patient's failure of conservative treatment which is included oral medications such as Celebrex, viscosupplementation injections and cortisone injections along with quad strengthening exercise.  Recommend left total knee arthroplasty.  Will work on setting him up for left total knee arthroplasty in the near future.  Questions were encouraged and answered at length.

## 2023-09-17 DIAGNOSIS — M542 Cervicalgia: Secondary | ICD-10-CM | POA: Diagnosis not present

## 2023-09-17 DIAGNOSIS — M9902 Segmental and somatic dysfunction of thoracic region: Secondary | ICD-10-CM | POA: Diagnosis not present

## 2023-09-17 DIAGNOSIS — M9901 Segmental and somatic dysfunction of cervical region: Secondary | ICD-10-CM | POA: Diagnosis not present

## 2023-09-17 DIAGNOSIS — M9905 Segmental and somatic dysfunction of pelvic region: Secondary | ICD-10-CM | POA: Diagnosis not present

## 2023-09-17 DIAGNOSIS — M9903 Segmental and somatic dysfunction of lumbar region: Secondary | ICD-10-CM | POA: Diagnosis not present

## 2023-09-17 DIAGNOSIS — M25562 Pain in left knee: Secondary | ICD-10-CM | POA: Diagnosis not present

## 2023-09-17 DIAGNOSIS — M62830 Muscle spasm of back: Secondary | ICD-10-CM | POA: Diagnosis not present

## 2023-09-17 DIAGNOSIS — M546 Pain in thoracic spine: Secondary | ICD-10-CM | POA: Diagnosis not present

## 2023-09-18 ENCOUNTER — Other Ambulatory Visit: Payer: Self-pay

## 2023-10-02 NOTE — Pre-Procedure Instructions (Signed)
 Surgical Instructions    Your procedure is scheduled on Tuesday, March 18th.   Report to [facility] Main Entrance "A" at 0930 A.M., then check in with the Admitting office.  Call this number if you have problems the morning of surgery:  [phone]  If you have any questions prior to your surgery date call [phone]: Open Monday-Friday 8am-4pm If you experience any cold or flu symptoms such as cough, fever, chills, shortness of breath, etc. between now and your scheduled surgery, please notify us at the above number.     Remember:  Do not eat after midnight the night before your surgery  You may drink clear liquids until 0830 the morning of your surgery.   Clear liquids allowed are: Water, Non-Citrus Juices (without pulp), Carbonated Beverages, Clear Tea, Black Coffee Only (NO MILK, CREAM OR POWDERED CREAMER of any kind), and Gatorade.    Take these medicines the morning of surgery with A SIP OF WATER:             acetaminophen (TYLENOL) as needed   As of today, STOP taking any Aspirin (unless otherwise instructed by your surgeon) Aleve, Naproxen, Ibuprofen, Motrin, Advil, Goody's, BC's, all herbal medications, fish oil, and all vitamins. This includes celecoxib (CELEBREX).  WHAT DO I DO ABOUT MY DIABETES MEDICATION?   Do not take oral diabetes medicines (pills) the morning of surgery. Last dose of Metformin is on 03/17. Last dose of glipiZIDE (GLUCOTROL) in the morning of 03/17. Please hold Ozempic 7 days prior to surgery. Last dose no later than 03/10.   HOW TO MANAGE YOUR DIABETES BEFORE AND AFTER SURGERY  Why is it important to control my blood sugar before and after surgery? Improving blood sugar levels before and after surgery helps healing and can limit problems. A way of improving blood sugar control is eating a healthy diet by:  Eating less sugar and carbohydrates  Increasing activity/exercise  Talking with your doctor about reaching your blood sugar goals High  blood sugars (greater than 180 mg/dL) can raise your risk of infections and slow your recovery, so you will need to focus on controlling your diabetes during the weeks before surgery. Make sure that the doctor who takes care of your diabetes knows about your planned surgery including the date and location.  How do I manage my blood sugar before surgery? Check your blood sugar at least 4 times a day, starting 2 days before surgery, to make sure that the level is not too high or low.  Check your blood sugar the morning of your surgery when you wake up and every 2 hours until you get to the Short Stay unit.  If your blood sugar is less than 70 mg/dL, you will need to treat for low blood sugar: Do not take insulin. Treat a low blood sugar (less than 70 mg/dL) with  cup of clear juice (cranberry or apple), 4 glucose tablets, OR glucose gel. Recheck blood sugar in 15 minutes after treatment (to make sure it is greater than 70 mg/dL). If your blood sugar is not greater than 70 mg/dL on recheck, call [phone] for further instructions. Report your blood sugar to the short stay nurse when you get to Short Stay.  If you are admitted to the hospital after surgery: Your blood sugar will be checked by the staff and you will probably be given insulin after surgery (instead of oral diabetes medicines) to make sure you have good blood sugar levels. The goal for blood sugar  control after surgery is 80-180 mg/dL.                       Do NOT Smoke (Tobacco/Vaping) for 24 hours prior to your procedure.  If you use a CPAP at night, you may bring your mask/headgear for your overnight stay.   Contacts, glasses, piercing's, hearing aid's, dentures or partials may not be worn into surgery, please bring cases for these belongings.    For patients admitted to the hospital, discharge time will be determined by your treatment team.   Patients discharged the day of surgery will not be allowed to drive home, and  someone needs to stay with them for 24 hours.  SURGICAL WAITING ROOM VISITATION Patients having surgery or a procedure may have no more than 2 support people in the waiting area - these visitors may rotate.   Children under the age of 74 must have an adult with them who is not the patient. If the patient needs to stay at the hospital during part of their recovery, the visitor guidelines for inpatient rooms apply. Pre-op nurse will coordinate an appropriate time for 1 support person to accompany patient in pre-op.  This support person may not rotate.   Please refer to the [facility] website for the visitor guidelines for Inpatients (after your surgery is over and you are in a regular room).    Special instructions:     Pre-operative 5 CHG Bath Instructions   You can play a key role in reducing the risk of infection after surgery. Your skin needs to be as free of germs as possible. You can reduce the number of germs on your skin by washing with CHG (chlorhexidine gluconate) soap before surgery. CHG is an antiseptic soap that kills germs and continues to kill germs even after washing.   DO NOT use if you have an allergy to chlorhexidine/CHG or antibacterial soaps. If your skin becomes reddened or irritated, stop using the CHG and notify one of our RNs at [phone].   Please shower with the CHG soap starting 4 days before surgery using the following schedule:     Please keep in mind the following:  DO NOT shave, including legs and underarms, starting the day of your first shower.   You may shave your face at any point before/day of surgery.  Place clean sheets on your bed the day you start using CHG soap. Use a clean washcloth (not used since being washed) for each shower. DO NOT sleep with pets once you start using the CHG.   CHG Shower Instructions:  If you choose to wash your hair and private area, wash first with your normal shampoo/soap.  After you use shampoo/soap, rinse your  hair and body thoroughly to remove shampoo/soap residue.  Turn the water OFF and apply about 3 tablespoons (45 ml) of CHG soap to a CLEAN washcloth.  Apply CHG soap ONLY FROM YOUR NECK DOWN TO YOUR TOES (washing for 3-5 minutes)  DO NOT use CHG soap on face, private areas, open wounds, or sores.  Pay special attention to the area where your surgery is being performed.  If you are having back surgery, having someone wash your back for you may be helpful. Wait 2 minutes after CHG soap is applied, then you may rinse off the CHG soap.  Pat dry with a clean towel  Put on clean clothes/pajamas   If you choose to wear lotion, please use ONLY the CHG-compatible lotions  on the back of this paper.     Additional instructions for the day of surgery: DO NOT APPLY any lotions, deodorants, cologne, or perfumes.   Put on clean/comfortable clothes.  Brush your teeth.  Ask your nurse before applying any prescription medications to the skin.      CHG Compatible Lotions   Aveeno Moisturizing lotion  Cetaphil Moisturizing Cream  Cetaphil Moisturizing Lotion  Clairol Herbal Essence Moisturizing Lotion, Dry Skin  Clairol Herbal Essence Moisturizing Lotion, Extra Dry Skin  Clairol Herbal Essence Moisturizing Lotion, Normal Skin  Curel Age Defying Therapeutic Moisturizing Lotion with Alpha Hydroxy  Curel Extreme Care Body Lotion  Curel Soothing Hands Moisturizing Hand Lotion  Curel Therapeutic Moisturizing Cream, Fragrance-Free  Curel Therapeutic Moisturizing Lotion, Fragrance-Free  Curel Therapeutic Moisturizing Lotion, Original Formula  Eucerin Daily Replenishing Lotion  Eucerin Dry Skin Therapy Plus Alpha Hydroxy Crme  Eucerin Dry Skin Therapy Plus Alpha Hydroxy Lotion  Eucerin Original Crme  Eucerin Original Lotion  Eucerin Plus Crme Eucerin Plus Lotion  Eucerin TriLipid Replenishing Lotion  Keri Anti-Bacterial Hand Lotion  Keri Deep Conditioning Original Lotion Dry Skin Formula Softly  Scented  Keri Deep Conditioning Original Lotion, Fragrance Free Sensitive Skin Formula  Keri Lotion Fast Absorbing Fragrance Free Sensitive Skin Formula  Keri Lotion Fast Absorbing Softly Scented Dry Skin Formula  Keri Original Lotion  Keri Skin Renewal Lotion Keri Silky Smooth Lotion  Keri Silky Smooth Sensitive Skin Lotion  Nivea Body Creamy Conditioning Oil  Nivea Body Extra Enriched Lotion  Nivea Body Original Lotion  Nivea Body Sheer Moisturizing Lotion Nivea Crme  Nivea Skin Firming Lotion  NutraDerm 30 Skin Lotion  NutraDerm Skin Lotion  NutraDerm Therapeutic Skin Cream  NutraDerm Therapeutic Skin Lotion  ProShield Protective Hand Cream  Provon moisturizing lotion   If you received a COVID test during your pre-op visit  it is requested that you wear a mask when out in public, stay away from anyone that may not be feeling well and notify your surgeon if you develop symptoms. If you have been in contact with anyone that has tested positive in the last 10 days please notify you surgeon.

## 2023-10-03 ENCOUNTER — Encounter (HOSPITAL_COMMUNITY)
Admission: RE | Admit: 2023-10-03 | Discharge: 2023-10-03 | Disposition: A | Discharge status: Home / Self Care | Source: Ambulatory Visit | Attending: Orthopaedic Surgery | Admitting: Orthopaedic Surgery

## 2023-10-03 ENCOUNTER — Other Ambulatory Visit: Payer: Self-pay

## 2023-10-03 ENCOUNTER — Encounter (HOSPITAL_COMMUNITY): Payer: Self-pay

## 2023-10-03 VITALS — BP 150/93 | HR 79 | Temp 97.0°F | Resp 17 | Ht 69.0 in | Wt 226.6 lb

## 2023-10-03 DIAGNOSIS — M1712 Unilateral primary osteoarthritis, left knee: Secondary | ICD-10-CM | POA: Insufficient documentation

## 2023-10-03 DIAGNOSIS — E119 Type 2 diabetes mellitus without complications: Secondary | ICD-10-CM | POA: Diagnosis not present

## 2023-10-03 DIAGNOSIS — Z01818 Encounter for other preprocedural examination: Secondary | ICD-10-CM | POA: Diagnosis not present

## 2023-10-03 HISTORY — DX: Malignant melanoma of skin, unspecified: C43.9

## 2023-10-03 HISTORY — DX: Lymphedema, not elsewhere classified: I89.0

## 2023-10-03 LAB — HEMOGLOBIN A1C
Hgb A1c MFr Bld: 6.9 % — ABNORMAL HIGH (ref 4.8–5.6)
Mean Plasma Glucose: 151.33 mg/dL

## 2023-10-03 LAB — CBC
HCT: 44.5 % (ref 39.0–52.0)
Hemoglobin: 15.2 g/dL (ref 13.0–17.0)
MCH: 32.7 pg (ref 26.0–34.0)
MCHC: 34.2 g/dL (ref 30.0–36.0)
MCV: 95.7 fL (ref 80.0–100.0)
Platelets: 261 10*3/uL (ref 150–400)
RBC: 4.65 MIL/uL (ref 4.22–5.81)
RDW: 12.2 % (ref 11.5–15.5)
WBC: 7.6 10*3/uL (ref 4.0–10.5)
nRBC: 0 % (ref 0.0–0.2)

## 2023-10-03 LAB — SURGICAL PCR SCREEN
MRSA, PCR: NEGATIVE
Staphylococcus aureus: NEGATIVE

## 2023-10-03 LAB — BASIC METABOLIC PANEL
Anion gap: 8 (ref 5–15)
BUN: 15 mg/dL (ref 8–23)
CO2: 26 mmol/L (ref 22–32)
Calcium: 9.3 mg/dL (ref 8.9–10.3)
Chloride: 104 mmol/L (ref 98–111)
Creatinine, Ser: 1.17 mg/dL (ref 0.61–1.24)
GFR, Estimated: >60 mL/min (ref >60)
Glucose, Bld: 148 mg/dL — ABNORMAL HIGH (ref 70–99)
Potassium: 4.6 mmol/L (ref 3.5–5.1)
Sodium: 138 mmol/L (ref 135–145)

## 2023-10-03 LAB — GLUCOSE, CAPILLARY: Glucose-Capillary: 165 mg/dL — ABNORMAL HIGH (ref 70–99)

## 2023-10-03 NOTE — Progress Notes (Signed)
 PCP - Dr. Fara Chute Cardiologist -   PPM/ICD - denies Device Orders - na Rep Notified - na  Chest x-ray - na EKG - PAT, 10/03/2023 Stress Test -  ECHO -  Cardiac Cath -   Sleep Study - denies CPAP - na  Type II diabetic, blood sugar 165 at PAT appointment Fasting Blood Sugar - 90-170 Checks Blood Sugar: daily Metformin, hold DOS Glipizide, hold night before and DOS  Last dose of GLP1 agonist-  Ozempic, states last dose 10/06/2023  Blood Thinner Instructions: denies Aspirin Instructions:denies  ERAS Protcol - Clears until 0830  Anesthesia review: No  Patient denies shortness of breath, fever, cough and chest pain at PAT appointment   All instructions explained to the patient, with a verbal understanding of the material. Patient agrees to go over the instructions while at home for a better understanding. Patient also instructed to self quarantine after being tested for COVID-19. The opportunity to ask questions was provided.

## 2023-10-13 ENCOUNTER — Telehealth: Payer: Self-pay | Admitting: *Deleted

## 2023-10-13 NOTE — Telephone Encounter (Signed)
 [facility] pre-op call completed.

## 2023-10-13 NOTE — Care Plan (Signed)
 [facility] RNCM call to patient and his wife to review his upcoming Left total knee arthroplasty with Dr. Magnus Ivan on 10/14/23 at [facility]. He is agreeable to case management. His plan is to return home with assistance from his wife. He has a RW already and handicapped toilets in the home. Anticipate HHPT will be needed after a short hospital stay. Referral made to [facility] after choice provided. Reviewed post op care instructions. Will continue to follow for needs.

## 2023-10-13 NOTE — H&P (Signed)
 TOTAL KNEE ADMISSION H&P  Patient is being admitted for left total knee arthroplasty.  Subjective:  Chief Complaint:left knee pain.  HPI: Luis Fitzgerald, 68 y.o. male, has a history of pain and functional disability in the left knee due to arthritis and has failed non-surgical conservative treatments for greater than 12 weeks to includeNSAID's and/or analgesics, corticosteriod injections, viscosupplementation injections, and activity modification.  Onset of symptoms was gradual, starting several years ago with gradually worsening course since that time. The patient noted no past surgery on the left knee(s).  Patient currently rates pain in the left knee(s) at 10 out of 10 with activity. Patient has night pain, worsening of pain with activity and weight bearing, pain that interferes with activities of daily living, pain with passive range of motion, crepitus, and joint swelling.  Patient has evidence of subchondral sclerosis, periarticular osteophytes, and joint space narrowing by imaging studies. There is no active infection.  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   Current Outpatient Medications  Medication Sig Dispense Refill Last Dose/Taking   acetaminophen (TYLENOL) 500 MG tablet Take 500-1,000 mg by mouth every 8 (eight) hours as needed for mild pain (pain score 1-3) or moderate pain (pain score  4-6).   Taking As Needed   atorvastatin (LIPITOR) 10 MG tablet Take 10 mg by mouth at bedtime.   Taking   celecoxib (CELEBREX) 200 MG capsule Take 200 mg by mouth in the morning.   Taking   glipiZIDE (GLUCOTROL) 10 MG tablet Take 5 mg by mouth 2 (two) times daily before a meal.   Taking   metFORMIN (GLUCOPHAGE-XR) 500 MG 24 hr tablet Take 1,000 mg by mouth 2 (two) times daily with a meal.   Taking   OZEMPIC, 0.25 OR 0.5 MG/DOSE, 2 MG/3ML SOPN Inject 0.5 mg into the skin once a week.   Taking   Allergies  Allergen Reactions   Other Other (See Comments)    Pecans -no other nuts   Pecan Extract Itching    " ball of fire in chest"    Social History   Tobacco Use   Smoking status: Never   Smokeless tobacco: Never  Substance Use Topics   Alcohol use: No    No family history on file.   Review of Systems  Objective:  Physical Exam Vitals reviewed.  Constitutional:      Appearance: Normal appearance. He is normal weight.  HENT:     Head: Normocephalic and atraumatic.  Eyes:     Extraocular Movements: Extraocular movements intact.     Pupils: Pupils are equal, round, and reactive to light.  Cardiovascular:     Rate and Rhythm: Normal rate and regular rhythm.     Pulses: Normal pulses.  Pulmonary:     Effort: Pulmonary effort is normal.     Breath sounds:  Normal breath sounds.  Abdominal:     Palpations: Abdomen is soft.  Musculoskeletal:     Cervical back: Normal range of motion and neck supple.     Left knee: Effusion, bony tenderness and crepitus present. Decreased range of motion. Tenderness present over the medial joint line and lateral joint line. Abnormal alignment.  Neurological:     Mental Status: He is alert and oriented to person, place, and time.  Psychiatric:        Behavior: Behavior normal.     Vital signs in last 24 hours:    Labs:   Estimated body mass index is 33.46 kg/m as calculated from the following:   Height as of 10/03/23: 5\' 9"  (1.753 m).    Weight as of 10/03/23: 102.8 kg.   Imaging Review Plain radiographs demonstrate severe degenerative joint disease of the left knee(s). The overall alignment ismild varus. The bone quality appears to be excellent for age and reported activity level.      Assessment/Plan:  End stage arthritis, left knee   The patient history, physical examination, clinical judgment of the provider and imaging studies are consistent with end stage degenerative joint disease of the left knee(s) and total knee arthroplasty is deemed medically necessary. The treatment options including medical management, injection therapy arthroscopy and arthroplasty were discussed at length. The risks and benefits of total knee arthroplasty were presented and reviewed. The risks due to aseptic loosening, infection, stiffness, patella tracking problems, thromboembolic complications and other imponderables were discussed. The patient acknowledged the explanation, agreed to proceed with the plan and consent was signed. Patient is being admitted for inpatient treatment for surgery, pain control, PT, OT, prophylactic antibiotics, VTE prophylaxis, progressive ambulation and ADL's and discharge planning. The patient is planning to be discharged home with home health services

## 2023-10-14 ENCOUNTER — Other Ambulatory Visit: Payer: Self-pay | Admitting: *Deleted

## 2023-10-14 ENCOUNTER — Observation Stay (HOSPITAL_COMMUNITY)
Admission: RE | Admit: 2023-10-14 | Discharge: 2023-10-16 | Disposition: A | Discharge status: Home Health | Payer: Medicare Other | Attending: Orthopaedic Surgery | Admitting: Orthopaedic Surgery

## 2023-10-14 ENCOUNTER — Encounter (HOSPITAL_COMMUNITY)
Admission: RE | Disposition: A | Discharge status: Home Health | Payer: Self-pay | Source: Home / Self Care | Attending: Orthopaedic Surgery

## 2023-10-14 ENCOUNTER — Other Ambulatory Visit: Payer: Self-pay

## 2023-10-14 ENCOUNTER — Encounter (HOSPITAL_COMMUNITY): Payer: Self-pay | Admitting: Orthopaedic Surgery

## 2023-10-14 ENCOUNTER — Observation Stay (HOSPITAL_COMMUNITY)

## 2023-10-14 ENCOUNTER — Ambulatory Visit (HOSPITAL_COMMUNITY)

## 2023-10-14 DIAGNOSIS — Z7984 Long term (current) use of oral hypoglycemic drugs: Secondary | ICD-10-CM | POA: Diagnosis not present

## 2023-10-14 DIAGNOSIS — Z96652 Presence of left artificial knee joint: Secondary | ICD-10-CM

## 2023-10-14 DIAGNOSIS — M1712 Unilateral primary osteoarthritis, left knee: Secondary | ICD-10-CM

## 2023-10-14 DIAGNOSIS — Z79899 Other long term (current) drug therapy: Secondary | ICD-10-CM | POA: Insufficient documentation

## 2023-10-14 DIAGNOSIS — Z471 Aftercare following joint replacement surgery: Secondary | ICD-10-CM | POA: Diagnosis not present

## 2023-10-14 DIAGNOSIS — E119 Type 2 diabetes mellitus without complications: Secondary | ICD-10-CM | POA: Insufficient documentation

## 2023-10-14 DIAGNOSIS — G8918 Other acute postprocedural pain: Secondary | ICD-10-CM | POA: Diagnosis not present

## 2023-10-14 DIAGNOSIS — Z85828 Personal history of other malignant neoplasm of skin: Secondary | ICD-10-CM | POA: Insufficient documentation

## 2023-10-14 HISTORY — PX: TOTAL KNEE ARTHROPLASTY: SHX125

## 2023-10-14 LAB — GLUCOSE, CAPILLARY
Glucose-Capillary: 133 mg/dL — ABNORMAL HIGH (ref 70–99)
Glucose-Capillary: 106 mg/dL — ABNORMAL HIGH (ref 70–99)
Glucose-Capillary: 109 mg/dL — ABNORMAL HIGH (ref 70–99)
Glucose-Capillary: 83 mg/dL (ref 70–99)
Glucose-Capillary: 153 mg/dL — ABNORMAL HIGH (ref 70–99)

## 2023-10-14 SURGERY — ARTHROPLASTY, KNEE, TOTAL
Anesthesia: Spinal | Site: Knee | Laterality: Left

## 2023-10-14 MED ORDER — SODIUM CHLORIDE 0.9 % IR SOLN
Status: DC | PRN
  Administered 2023-10-14: 1000 mL

## 2023-10-14 MED ORDER — ACETAMINOPHEN 325 MG PO TABS: 325.0000 mg | ORAL_TABLET | Freq: Four times a day (QID) | ORAL | Status: DC | PRN

## 2023-10-14 MED ORDER — PROPOFOL 10 MG/ML IV BOLUS
INTRAVENOUS | Status: AC
  Filled 2023-10-14: qty 20

## 2023-10-14 MED ORDER — INSULIN ASPART 100 UNIT/ML IJ SOLN: 0.0000 [IU] | INTRAMUSCULAR | Status: DC | PRN

## 2023-10-14 MED ORDER — TRANEXAMIC ACID-NACL 1000-0.7 MG/100ML-% IV SOLN
1000.0000 mg | INTRAVENOUS | Status: AC
  Administered 2023-10-14: 1000 mg via INTRAVENOUS
  Filled 2023-10-14: qty 100

## 2023-10-14 MED ORDER — BUPIVACAINE-EPINEPHRINE (PF) 0.25% -1:200000 IJ SOLN
INTRAMUSCULAR | Status: AC
  Filled 2023-10-14: qty 30

## 2023-10-14 MED ORDER — STERILE WATER FOR IRRIGATION IR SOLN
Status: DC | PRN
  Administered 2023-10-14: 1000 mL

## 2023-10-14 MED ORDER — MENTHOL 3 MG MT LOZG: 1.0000 | LOZENGE | OROMUCOSAL | Status: DC | PRN

## 2023-10-14 MED ORDER — METHOCARBAMOL 1000 MG/10ML IJ SOLN: 500.0000 mg | Freq: Four times a day (QID) | INTRAMUSCULAR | Status: DC | PRN

## 2023-10-14 MED ORDER — OXYCODONE HCL 5 MG PO TABS
5.0000 mg | ORAL_TABLET | ORAL | Status: DC | PRN
  Administered 2023-10-14 – 2023-10-15 (×5): 10 mg via ORAL
  Administered 2023-10-15: 5 mg via ORAL
  Filled 2023-10-14 (×4): qty 2
  Filled 2023-10-14: qty 1
  Filled 2023-10-14: qty 2

## 2023-10-14 MED ORDER — ROPIVACAINE HCL 5 MG/ML IJ SOLN
INTRAMUSCULAR | Status: DC | PRN
  Administered 2023-10-14: 20 mL via PERINEURAL

## 2023-10-14 MED ORDER — CLONIDINE HCL (ANALGESIA) 100 MCG/ML EP SOLN
EPIDURAL | Status: DC | PRN
  Administered 2023-10-14: 50 ug

## 2023-10-14 MED ORDER — MIDAZOLAM HCL 2 MG/2ML IJ SOLN
INTRAMUSCULAR | Status: AC
  Administered 2023-10-14: 1 mg
  Filled 2023-10-14: qty 2

## 2023-10-14 MED ORDER — HYDROMORPHONE HCL 1 MG/ML IJ SOLN: 0.5000 mg | INTRAMUSCULAR | Status: DC | PRN

## 2023-10-14 MED ORDER — ONDANSETRON HCL 4 MG/2ML IJ SOLN
4.0000 mg | Freq: Four times a day (QID) | INTRAMUSCULAR | Status: DC | PRN
  Administered 2023-10-15 – 2023-10-16 (×2): 4 mg via INTRAVENOUS
  Filled 2023-10-14 (×2): qty 2

## 2023-10-14 MED ORDER — ORAL CARE MOUTH RINSE: 15.0000 mL | Freq: Once | OROMUCOSAL | Status: AC

## 2023-10-14 MED ORDER — METFORMIN HCL ER 500 MG PO TB24: 1000.0000 mg | ORAL_TABLET | Freq: Two times a day (BID) | ORAL | Status: DC

## 2023-10-14 MED ORDER — PHENOL 1.4 % MT LIQD: 1.0000 | OROMUCOSAL | Status: DC | PRN

## 2023-10-14 MED ORDER — ASPIRIN 81 MG PO CHEW
81.0000 mg | CHEWABLE_TABLET | Freq: Two times a day (BID) | ORAL | Status: DC
  Administered 2023-10-14 – 2023-10-16 (×4): 81 mg via ORAL
  Filled 2023-10-14 (×4): qty 1

## 2023-10-14 MED ORDER — METOCLOPRAMIDE HCL 5 MG PO TABS: 5.0000 mg | ORAL_TABLET | Freq: Three times a day (TID) | ORAL | Status: DC | PRN

## 2023-10-14 MED ORDER — DOCUSATE SODIUM 100 MG PO CAPS
100.0000 mg | ORAL_CAPSULE | Freq: Two times a day (BID) | ORAL | Status: DC
  Administered 2023-10-14 – 2023-10-16 (×4): 100 mg via ORAL
  Filled 2023-10-14 (×4): qty 1

## 2023-10-14 MED ORDER — CHLORHEXIDINE GLUCONATE 0.12 % MT SOLN
15.0000 mL | Freq: Once | OROMUCOSAL | Status: AC
  Administered 2023-10-14: 15 mL via OROMUCOSAL
  Filled 2023-10-14: qty 15

## 2023-10-14 MED ORDER — GLIPIZIDE 5 MG PO TABS: 5.0000 mg | ORAL_TABLET | Freq: Two times a day (BID) | ORAL | Status: DC

## 2023-10-14 MED ORDER — ALUM & MAG HYDROXIDE-SIMETH 200-200-20 MG/5ML PO SUSP: 30.0000 mL | ORAL | Status: DC | PRN

## 2023-10-14 MED ORDER — LACTATED RINGERS IV SOLN
INTRAVENOUS | Status: DC | PRN
Start: 2023-10-14 — End: 2023-10-14

## 2023-10-14 MED ORDER — FENTANYL CITRATE (PF) 100 MCG/2ML IJ SOLN: 25.0000 ug | INTRAMUSCULAR | Status: DC | PRN

## 2023-10-14 MED ORDER — PROPOFOL 10 MG/ML IV BOLUS
INTRAVENOUS | Status: DC | PRN
  Administered 2023-10-14: 50 mg via INTRAVENOUS

## 2023-10-14 MED ORDER — METOCLOPRAMIDE HCL 5 MG/ML IJ SOLN: 5.0000 mg | Freq: Three times a day (TID) | INTRAMUSCULAR | Status: DC | PRN

## 2023-10-14 MED ORDER — CEFAZOLIN SODIUM-DEXTROSE 2-4 GM/100ML-% IV SOLN
2.0000 g | Freq: Four times a day (QID) | INTRAVENOUS | Status: AC
  Administered 2023-10-14 (×2): 2 g via INTRAVENOUS
  Filled 2023-10-14 (×2): qty 100

## 2023-10-14 MED ORDER — FENTANYL CITRATE (PF) 100 MCG/2ML IJ SOLN: 50.0000 ug | Freq: Once | INTRAMUSCULAR | Status: DC

## 2023-10-14 MED ORDER — MELATONIN 5 MG PO TABS
5.0000 mg | ORAL_TABLET | Freq: Once | ORAL | Status: AC | PRN
  Administered 2023-10-14: 5 mg via ORAL
  Filled 2023-10-14: qty 1

## 2023-10-14 MED ORDER — METHOCARBAMOL 500 MG PO TABS
500.0000 mg | ORAL_TABLET | Freq: Four times a day (QID) | ORAL | Status: DC | PRN
  Administered 2023-10-14 – 2023-10-15 (×5): 500 mg via ORAL
  Filled 2023-10-14 (×6): qty 1

## 2023-10-14 MED ORDER — DIPHENHYDRAMINE HCL 12.5 MG/5ML PO ELIX: 12.5000 mg | ORAL_SOLUTION | ORAL | Status: DC | PRN

## 2023-10-14 MED ORDER — ONDANSETRON HCL 4 MG PO TABS: 4.0000 mg | ORAL_TABLET | Freq: Four times a day (QID) | ORAL | Status: DC | PRN

## 2023-10-14 MED ORDER — OXYCODONE HCL 5 MG PO TABS
10.0000 mg | ORAL_TABLET | ORAL | Status: DC | PRN
  Administered 2023-10-15 – 2023-10-16 (×2): 15 mg via ORAL
  Filled 2023-10-14 (×2): qty 3

## 2023-10-14 MED ORDER — AMISULPRIDE (ANTIEMETIC) 5 MG/2ML IV SOLN: 10.0000 mg | Freq: Once | INTRAVENOUS | Status: DC | PRN

## 2023-10-14 MED ORDER — BUPIVACAINE-EPINEPHRINE 0.25% -1:200000 IJ SOLN
INTRAMUSCULAR | Status: DC | PRN
  Administered 2023-10-14: 30 mL

## 2023-10-14 MED ORDER — BUPIVACAINE IN DEXTROSE 0.75-8.25 % IT SOLN
INTRATHECAL | Status: DC | PRN
  Administered 2023-10-14: 1.6 mL via INTRATHECAL

## 2023-10-14 MED ORDER — PHENYLEPHRINE HCL-NACL 20-0.9 MG/250ML-% IV SOLN
INTRAVENOUS | Status: DC | PRN
Start: 2023-10-14 — End: 2023-10-14
  Administered 2023-10-14: 20 ug/min via INTRAVENOUS

## 2023-10-14 MED ORDER — MIDAZOLAM HCL 2 MG/2ML IJ SOLN: 1.0000 mg | Freq: Once | INTRAMUSCULAR | Status: DC

## 2023-10-14 MED ORDER — CEFAZOLIN SODIUM-DEXTROSE 2-4 GM/100ML-% IV SOLN
2.0000 g | INTRAVENOUS | Status: AC
  Administered 2023-10-14: 2 g via INTRAVENOUS
  Filled 2023-10-14: qty 100

## 2023-10-14 MED ORDER — PROPOFOL 500 MG/50ML IV EMUL
INTRAVENOUS | Status: DC | PRN
  Administered 2023-10-14: 125 ug/kg/min via INTRAVENOUS

## 2023-10-14 MED ORDER — SODIUM CHLORIDE 0.9 % IV SOLN: INTRAVENOUS | Status: AC

## 2023-10-14 MED ORDER — 0.9 % SODIUM CHLORIDE (POUR BTL) OPTIME
TOPICAL | Status: DC | PRN
  Administered 2023-10-14: 1000 mL

## 2023-10-14 MED ORDER — FENTANYL CITRATE (PF) 100 MCG/2ML IJ SOLN
INTRAMUSCULAR | Status: AC
  Administered 2023-10-14: 50 ug
  Filled 2023-10-14: qty 2

## 2023-10-14 MED ORDER — PANTOPRAZOLE SODIUM 40 MG PO TBEC
40.0000 mg | DELAYED_RELEASE_TABLET | Freq: Every day | ORAL | Status: DC
  Administered 2023-10-14 – 2023-10-16 (×3): 40 mg via ORAL
  Filled 2023-10-14 (×3): qty 1

## 2023-10-14 SURGICAL SUPPLY — 62 items
BAG COUNTER SPONGE SURGICOUNT (BAG) ×1 IMPLANT
BANDAGE ESMARK 6X9 LF (GAUZE/BANDAGES/DRESSINGS) ×1 IMPLANT
BLADE SAG 18X100X1.27 (BLADE) ×1 IMPLANT
BNDG ELASTIC 6INX 5YD STR LF (GAUZE/BANDAGES/DRESSINGS) ×2 IMPLANT
BNDG ESMARK 6X9 LF (GAUZE/BANDAGES/DRESSINGS) ×1 IMPLANT
BOWL SMART MIX CTS (DISPOSABLE) IMPLANT
COMP FEM KNEE STD PS 9 LT (Joint) ×1 IMPLANT
COMP PATELLA 3 PEG 35 (Joint) ×1 IMPLANT
COMP TIB KNEE PS 0D LT (Joint) ×1 IMPLANT
COMPONENT FEM KNEE STD PS 9 LT (Joint) IMPLANT
COMPONENT PATELLA 3 PEG 35 (Joint) IMPLANT
COMPONENT TIB KNEE PS 0D LT (Joint) IMPLANT
COOLER ICEMAN CLASSIC (MISCELLANEOUS) ×1 IMPLANT
COVER SURGICAL LIGHT HANDLE (MISCELLANEOUS) ×1 IMPLANT
CUFF TOURN SGL QUICK 42 (TOURNIQUET CUFF) IMPLANT
CUFF TRNQT CYL 34X4.125X (TOURNIQUET CUFF) ×1 IMPLANT
DRAPE EXTREMITY T 121X128X90 (DISPOSABLE) ×1 IMPLANT
DRAPE HALF SHEET 40X57 (DRAPES) ×1 IMPLANT
DRAPE U-SHAPE 47X51 STRL (DRAPES) ×1 IMPLANT
DRSG XEROFORM 1X8 (GAUZE/BANDAGES/DRESSINGS) IMPLANT
DURAPREP 26ML APPLICATOR (WOUND CARE) ×1 IMPLANT
ELECT CAUTERY BLADE 6.4 (BLADE) ×1 IMPLANT
ELECT REM PT RETURN 9FT ADLT (ELECTROSURGICAL) ×1 IMPLANT
ELECTRODE REM PT RTRN 9FT ADLT (ELECTROSURGICAL) ×1 IMPLANT
FACESHIELD WRAPAROUND (MASK) ×2 IMPLANT
FACESHIELD WRAPAROUND OR TEAM (MASK) ×2 IMPLANT
GAUZE PAD ABD 8X10 STRL (GAUZE/BANDAGES/DRESSINGS) ×1 IMPLANT
GAUZE SPONGE 4X4 12PLY STRL (GAUZE/BANDAGES/DRESSINGS) ×1 IMPLANT
GAUZE XEROFORM 1X8 LF (GAUZE/BANDAGES/DRESSINGS) ×1 IMPLANT
GLOVE BIOGEL PI IND STRL 8 (GLOVE) ×2 IMPLANT
GLOVE ORTHO TXT STRL SZ7.5 (GLOVE) ×1 IMPLANT
GLOVE SURG ORTHO 8.0 STRL STRW (GLOVE) ×1 IMPLANT
GOWN STRL REUS W/ TWL LRG LVL3 (GOWN DISPOSABLE) IMPLANT
GOWN STRL REUS W/ TWL XL LVL3 (GOWN DISPOSABLE) ×2 IMPLANT
IMMOBILIZER KNEE 22 UNIV (SOFTGOODS) ×1 IMPLANT
IV NS 1000ML BAXH (IV SOLUTION) ×1 IMPLANT
KIT BASIN OR (CUSTOM PROCEDURE TRAY) ×1 IMPLANT
KIT TURNOVER KIT B (KITS) ×1 IMPLANT
MANIFOLD NEPTUNE II (INSTRUMENTS) ×1 IMPLANT
NDL 18GX1X1/2 (RX/OR ONLY) (NEEDLE) IMPLANT
NEEDLE 18GX1X1/2 (RX/OR ONLY) (NEEDLE) ×1 IMPLANT
NS IRRIG 1000ML POUR BTL (IV SOLUTION) ×1 IMPLANT
PACK TOTAL JOINT (CUSTOM PROCEDURE TRAY) ×1 IMPLANT
PAD ARMBOARD POSITIONER FOAM (MISCELLANEOUS) ×1 IMPLANT
PAD COLD SHLDR WRAP-ON (PAD) ×1 IMPLANT
PADDING CAST COTTON 6X4 STRL (CAST SUPPLIES) ×1 IMPLANT
PIN DRILL HDLS TROCAR 75 4PK (PIN) IMPLANT
SCREW FEMALE HEX FIX 25X2.5 (ORTHOPEDIC DISPOSABLE SUPPLIES) IMPLANT
SET HNDPC FAN SPRY TIP SCT (DISPOSABLE) ×1 IMPLANT
SET PAD KNEE POSITIONER (MISCELLANEOUS) ×1 IMPLANT
STAPLER SKIN PROX WIDE 3.9 (STAPLE) IMPLANT
STAPLER VISISTAT 35W (STAPLE) ×1 IMPLANT
STEM ARTISURF EF 12 SZ8-11 (Stem) IMPLANT
SUCTION TUBE FRAZIER 10FR DISP (SUCTIONS) ×1 IMPLANT
SUT VIC AB 0 CT1 27XBRD ANBCTR (SUTURE) ×1 IMPLANT
SUT VIC AB 1 CT1 27XBRD ANBCTR (SUTURE) ×2 IMPLANT
SUT VIC AB 2-0 CT1 TAPERPNT 27 (SUTURE) ×2 IMPLANT
SYR 50ML LL SCALE MARK (SYRINGE) IMPLANT
TOWEL GREEN STERILE (TOWEL DISPOSABLE) ×1 IMPLANT
TOWEL GREEN STERILE FF (TOWEL DISPOSABLE) ×1 IMPLANT
TRAY FOLEY MTR SLVR 16FR STAT (SET/KITS/TRAYS/PACK) IMPLANT
WATER STERILE IRR 1000ML POUR (IV SOLUTION) IMPLANT

## 2023-10-14 NOTE — Anesthesia Preprocedure Evaluation (Addendum)
 Anesthesia Evaluation  Patient identified by MRN, date of birth, ID band Patient awake    Reviewed: Allergy & Precautions, NPO status , Patient's Chart, lab work & pertinent test results  Airway Mallampati: III  TM Distance: >3 FB Neck ROM: Full    Dental   Pulmonary neg pulmonary ROS   Pulmonary exam normal        Cardiovascular negative cardio ROS Normal cardiovascular exam     Neuro/Psych negative neurological ROS     GI/Hepatic negative GI ROS, Neg liver ROS,,,  Endo/Other  diabetes, Type 2, Oral Hypoglycemic Agents    Renal/GU negative Renal ROS     Musculoskeletal  (+) Arthritis ,    Abdominal   Peds  Hematology negative hematology ROS (+)   Anesthesia Other Findings   Reproductive/Obstetrics                             Anesthesia Physical Anesthesia Plan  ASA: 2  Anesthesia Plan: Spinal   Post-op Pain Management: Regional block* and Tylenol PO (pre-op)*   Induction: Intravenous  PONV Risk Score and Plan: 1 and Dexamethasone, Ondansetron, Propofol infusion and Treatment may vary due to age or medical condition  Airway Management Planned: Natural Airway and Simple Face Mask  Additional Equipment:   Intra-op Plan:   Post-operative Plan: Extubation in OR  Informed Consent: I have reviewed the patients History and Physical, chart, labs and discussed the procedure including the risks, benefits and alternatives for the proposed anesthesia with the patient or authorized representative who has indicated his/her understanding and acceptance.     Dental advisory given  Plan Discussed with: CRNA  Anesthesia Plan Comments:        Anesthesia Quick Evaluation

## 2023-10-14 NOTE — Anesthesia Procedure Notes (Signed)
 Anesthesia Regional Block: Adductor canal block   Pre-Anesthetic Checklist: , timeout performed,  Correct Patient, Correct Site, Correct Laterality,  Correct Procedure, Correct Position, site marked,  Risks and benefits discussed,  Surgical consent,  Pre-op evaluation,  At surgeon's request and post-op pain management  Laterality: Left  Prep: chloraprep       Needles:  Injection technique: Single-shot  Needle Type: Echogenic Needle     Needle Length: 9cm  Needle Gauge: 21     Additional Needles:   Procedures:,,,, ultrasound used (permanent image in chart),,    Narrative:  Start time: 10/14/2023 11:05 AM End time: 10/14/2023 11:12 AM Injection made incrementally with aspirations every 5 mL.  Performed by: Personally  Anesthesiologist: Marcene Duos, MD

## 2023-10-14 NOTE — Interval H&P Note (Signed)
 History and Physical Interval Note: The patient understands that he is here today for a left total knee replacement to treat his significant left knee pain and arthritis.  There has been no acute or interval change in his medical status.  The risks and benefits of surgery have been discussed in detail and informed consent has been obtained.  The left operative knee has been marked.  10/14/2023 10:56 AM  Luis Fitzgerald  has presented today for surgery, with the diagnosis of left knee osteoarthritis.  The various methods of treatment have been discussed with the patient and family. After consideration of risks, benefits and other options for treatment, the patient has consented to  Procedure(s): LEFT TOTAL KNEE ARTHROPLASTY (Left) as a surgical intervention.  The patient's history has been reviewed, patient examined, no change in status, stable for surgery.  I have reviewed the patient's chart and labs.  Questions were answered to the patient's satisfaction.     Kathryne Hitch

## 2023-10-14 NOTE — Transfer of Care (Signed)
 Immediate Anesthesia Transfer of Care Note  Patient: Luis Fitzgerald  Procedure(s) Performed: LEFT TOTAL KNEE ARTHROPLASTY (Left: Knee)  Patient Location: PACU  Anesthesia Type:Spinal  Level of Consciousness: awake, alert , and oriented  Airway & Oxygen Therapy: Patient Spontanous Breathing and Patient connected to nasal cannula oxygen  Post-op Assessment: Report given to RN and Post -op Vital signs reviewed and stable  Post vital signs: Reviewed and stable  Last Vitals:  Vitals Value Taken Time  BP    Temp    Pulse 78 10/14/23 1359  Resp 14 10/14/23 1359  SpO2 92 % 10/14/23 1359  Vitals shown include unfiled device data.  Last Pain:  Vitals:   10/14/23 1140  TempSrc:   PainSc: 0-No pain      Patients Stated Pain Goal: 0 (10/14/23 0944)  Complications: No notable events documented.

## 2023-10-14 NOTE — Op Note (Signed)
 Operative Note  Date of operation: 10/14/2023 Preoperative diagnosis: Left knee primary osteoarthritis Postoperative diagnosis: Same  Procedure: Left press-fit total knee arthroplasty  Implants: Biomet/Zimmer persona press-fit knee system Implant Name Type Inv. Item Serial No. Manufacturer Lot No. LRB No. Used Action  COMP PATELLA 3 PEG 35 - [id number] Joint COMP PATELLA 3 PEG 35  ZIMMER [employer]ON(ORTH,TRAU,BIO,SG) [id number] Left 1 Implanted  STEM ARTISURF EF 12 SZ8-11 - [id number] Stem STEM ARTISURF EF 12 SZ8-11  ZIMMER [employer]ON(ORTH,TRAU,BIO,SG) [id number] Left 1 Implanted  COMP TIB KNEE PS 0D LT - [id number] Joint COMP TIB KNEE PS 0D LT  ZIMMER RECON(ORTH,TRAU,BIO,SG) [id number] Left 1 Implanted  COMP FEM KNEE STD PS 9 LT - [id number] Joint COMP FEM KNEE STD PS 9 LT  ZIMMER [employer]ON(ORTH,TRAU,BIO,SG) [id number] Left 1 Implanted   Surgeon: Vanita Panda. Magnus Ivan, MD Assistant: Rexene Edison, PA-C  Anesthesia: #1 left lower extremity adductor canal block, #2 spinal, #3 local Tourniquet time: Less than 45 minutes EBL: Less than 50 cc Complications: None Antibiotics: IV Ancef  Indications: The patient is a 68 year old gentleman that we have seen for many years now.  He has worsening debilitating arthritis involving his left knee.  He has tried and failed all forms conservative treatment for well over a year now.  At this point his left knee pain is daily and it is detrimentally affecting his mobility, his quality of life and his actives daily living to the point he wishes to proceed with a knee replacement and we agree with this as well.  We did discuss the risks of acute blood loss anemia, nerve or vessel injury, fracture, infection, DVT, implant failure and wound healing issues.  He understands that our goals are hopefully decreased pain, improved mobility, and improved quality of life.  Procedure description: After informed consent was obtained and the appropriate left knee was marked, anesthesia obtained  a left lower extremity adductor canal block in the holding room.  The patient was then brought to the operating room and set up on the operating table where spinal anesthesia was obtained.  He was then laid in supine position on the operating table and a Foley catheter was placed.  A nonsterile tractors placed around his upper left thigh and his left thigh, knee, leg and ankle were prepped and draped with DuraPrep and sterile drapes including a sterile stockinette.  His foot was prepped as well.  A timeout was called and he was identified as the correct patient and correct left knee.  An Esmarch was then used to wrap out the leg and the tourniquet was inflated to 300 mm of pressure.  With the knee in extended position a direct midline incision was carried over the patella and extended proximally and distally.  Dissection was carried down to the knee joint and a medial parapatellar arthrotomy is made finding a large joint effusion.  With the knee in a flexed position we found significant cartilage loss throughout the knee in all 3 compartments.  We removed remnants of the ACL as well as medial lateral meniscus.  We then used an extramedullary based cutting guide for making her proximal tibia cut correction for varus and valgus and a 7 degree slope.  We made this cut to take 2 mm off the low side and we did backed this down just 2 more millimeters.  We then used an intramedullary based cutting guide for distal femur cut setting this for a left knee at 5 degrees externally rotated and a 10 mm distal femoral cut.  We then brought the knee back down to full extension and achieve full extension with a 10 mm extension block.  We then went back to the femur and put a femoral sizing guide based off the epicondylar axis.  Based off of this we chose a size 9 femur.  We put a 4-in-1 cutting block for a size 9 femur and made our anterior and posterior cuts followed by her chamfer cuts.  We then backed the tibia and chose a size  F left tibial tray for coverage over the tibial plateau setting the rotation of the tibial tubercle and the femur.  Based off of this we then did our drill hole and keel punch and found excellent quality bone for press-fit implants.  We trialed our size F left tibial tray combined with our size 9 left CR standard femur.  We placed a 10 mm left medial congruent fixed-bearing polythene insert and went up to a 12 mm thickness insert we are pleased the range of motion and stability without insert.  We then made a patella cut and drilled 3 holes for a size 35 patella button.  With all transportation the knee we are pleased with range of motion and stability.  We then removed all transportation from the knee and irrigate the knee with normal saline solution using pulsatile lavage.  We then placed Marcaine with epinephrine around the arthrotomy.  Next with the knee in a flexed position we dried the knee roll well and placed our Biomet/Zimmer persona press-fit tibial tray for a left knee size F followed by press fitting our size 9 left CR standard femur.  We placed our 12 mm thickness left medial congruent polythene insert and press-fit our size 35 patella button.  Again we are pleased with range of motion and stability with all implants in place.  We then let the tourniquet down and hemostasis was obtained with electrocautery.  The joint capsule was closed with interrupted #1 Vicryl suture followed by 0 Vicryl close the deep tissue and 2-0 Vicryl to close subcutaneous tissue.  The skin was closed with staples.  Well-padded sterile dressing was applied.  The patient was taken to the recovery room in stable condition.  Rexene Edison, PA-C did assist during the entire case and beginning to end and his assistance was crucial and medically necessary for soft tissue management and retraction, helping guide implant placement and a layered closure of the wound.

## 2023-10-14 NOTE — Discharge Instructions (Signed)

## 2023-10-14 NOTE — Anesthesia Procedure Notes (Signed)
 Spinal  Patient location during procedure: OR Start time: 10/14/2023 12:23 PM End time: 10/14/2023 12:30 PM Reason for block: surgical anesthesia Staffing Performed: anesthesiologist  Anesthesiologist: Marcene Duos, MD Performed by: Marcene Duos, MD Authorized by: Marcene Duos, MD   Preanesthetic Checklist Completed: patient identified, IV checked, site marked, risks and benefits discussed, surgical consent, monitors and equipment checked, pre-op evaluation and timeout performed Spinal Block Patient position: sitting Prep: DuraPrep Patient monitoring: heart rate, cardiac monitor, continuous pulse ox and blood pressure Approach: midline Location: L4-5 Injection technique: single-shot Needle Needle type: Pencan  Needle gauge: 24 G Needle length: 9 cm Assessment Sensory level: T4 Events: CSF return

## 2023-10-15 ENCOUNTER — Encounter (HOSPITAL_COMMUNITY): Payer: Self-pay | Admitting: Orthopaedic Surgery

## 2023-10-15 DIAGNOSIS — M1712 Unilateral primary osteoarthritis, left knee: Secondary | ICD-10-CM | POA: Diagnosis not present

## 2023-10-15 DIAGNOSIS — E119 Type 2 diabetes mellitus without complications: Secondary | ICD-10-CM | POA: Diagnosis not present

## 2023-10-15 DIAGNOSIS — Z79899 Other long term (current) drug therapy: Secondary | ICD-10-CM | POA: Diagnosis not present

## 2023-10-15 DIAGNOSIS — Z7984 Long term (current) use of oral hypoglycemic drugs: Secondary | ICD-10-CM | POA: Diagnosis not present

## 2023-10-15 DIAGNOSIS — Z85828 Personal history of other malignant neoplasm of skin: Secondary | ICD-10-CM | POA: Diagnosis not present

## 2023-10-15 LAB — GLUCOSE, CAPILLARY
Glucose-Capillary: 212 mg/dL — ABNORMAL HIGH (ref 70–99)
Glucose-Capillary: 188 mg/dL — ABNORMAL HIGH (ref 70–99)
Glucose-Capillary: 188 mg/dL — ABNORMAL HIGH (ref 70–99)
Glucose-Capillary: 243 mg/dL — ABNORMAL HIGH (ref 70–99)

## 2023-10-15 LAB — BASIC METABOLIC PANEL
Anion gap: 5 (ref 5–15)
BUN: 12 mg/dL (ref 8–23)
CO2: 26 mmol/L (ref 22–32)
Calcium: 8.2 mg/dL — ABNORMAL LOW (ref 8.9–10.3)
Chloride: 101 mmol/L (ref 98–111)
Creatinine, Ser: 0.98 mg/dL (ref 0.61–1.24)
GFR, Estimated: >60 mL/min (ref >60)
Glucose, Bld: 221 mg/dL — ABNORMAL HIGH (ref 70–99)
Potassium: 4.1 mmol/L (ref 3.5–5.1)
Sodium: 132 mmol/L — ABNORMAL LOW (ref 135–145)

## 2023-10-15 LAB — CBC
HCT: 37.2 % — ABNORMAL LOW (ref 39.0–52.0)
Hemoglobin: 13.1 g/dL (ref 13.0–17.0)
MCH: 33.1 pg (ref 26.0–34.0)
MCHC: 35.2 g/dL (ref 30.0–36.0)
MCV: 93.9 fL (ref 80.0–100.0)
Platelets: 249 10*3/uL (ref 150–400)
RBC: 3.96 MIL/uL — ABNORMAL LOW (ref 4.22–5.81)
RDW: 12.1 % (ref 11.5–15.5)
WBC: 10.9 10*3/uL — ABNORMAL HIGH (ref 4.0–10.5)
nRBC: 0 % (ref 0.0–0.2)

## 2023-10-15 MED ORDER — TIZANIDINE HCL 4 MG PO TABS: 4.0000 mg | ORAL_TABLET | Freq: Three times a day (TID) | ORAL | 0 refills | Status: DC | PRN

## 2023-10-15 MED ORDER — OXYCODONE HCL 5 MG PO TABS: 5.0000 mg | ORAL_TABLET | Freq: Four times a day (QID) | ORAL | 0 refills | Status: DC | PRN

## 2023-10-15 MED ORDER — METFORMIN HCL ER 500 MG PO TB24
1000.0000 mg | ORAL_TABLET | Freq: Two times a day (BID) | ORAL | Status: DC
  Administered 2023-10-15 – 2023-10-16 (×3): 1000 mg via ORAL
  Filled 2023-10-15 (×3): qty 2

## 2023-10-15 MED ORDER — ASPIRIN 81 MG PO CHEW: 81.0000 mg | CHEWABLE_TABLET | Freq: Two times a day (BID) | ORAL | 0 refills | Status: AC

## 2023-10-15 MED ORDER — KETOROLAC TROMETHAMINE 15 MG/ML IJ SOLN
7.5000 mg | Freq: Four times a day (QID) | INTRAMUSCULAR | Status: AC
  Administered 2023-10-15 (×3): 7.5 mg via INTRAVENOUS
  Filled 2023-10-15 (×3): qty 1

## 2023-10-15 MED ORDER — PROPOFOL 1000 MG/100ML IV EMUL
INTRAVENOUS | Status: AC
  Filled 2023-10-15: qty 100

## 2023-10-15 MED ORDER — GLIPIZIDE 5 MG PO TABS
5.0000 mg | ORAL_TABLET | Freq: Two times a day (BID) | ORAL | Status: DC
  Administered 2023-10-15 – 2023-10-16 (×2): 5 mg via ORAL
  Filled 2023-10-15 (×3): qty 1

## 2023-10-15 MED ORDER — PHENYLEPHRINE HCL-NACL 20-0.9 MG/250ML-% IV SOLN
INTRAVENOUS | Status: AC
  Filled 2023-10-15: qty 250

## 2023-10-15 MED ORDER — SODIUM CHLORIDE 0.9 % IV BOLUS
500.0000 mL | Freq: Once | INTRAVENOUS | Status: AC
  Administered 2023-10-15: 500 mL via INTRAVENOUS

## 2023-10-15 NOTE — Progress Notes (Signed)
 Occupational Therapy Evaluation Patient Details Name: Luis Fitzgerald MRN: [id number] DOB: [birth year] Today's Date: 10/15/2023   History of Present Illness   68 yo s/p 3/18 L TKA PMH melanoma, DM, lymphedema L arm     Clinical Impressions Patient is s/p L TKA surgery resulting in functional limitations due to the deficits listed below (see OT problem list). Pt was indep with adls prior to admission. Pt at this time noted to have drop in BP and symptomatic. Pt also with increased pain and encouraged pain management strategies to reduce pain for PT BID sesion.  Patient will benefit from skilled OT acutely to increase independence and safety with ADLS to allow discharge HHOT.  *likely to need BSC (wider) and bench for tub      If plan is discharge home, recommend the following:   A lot of help with walking and/or transfers;A lot of help with bathing/dressing/bathroom     Functional Status Assessment   Patient has had a recent decline in their functional status and demonstrates the ability to make significant improvements in function in a reasonable and predictable amount of time.     Equipment Recommendations   BSC/3in1 (will need to further assess bariartic width BSC and bench for shower)     Recommendations for Other Services   Other (comment) (bone foam for positioning L LE into neutral position)     Precautions/Restrictions   Precautions Precautions: Knee;Fall Precaution/Restrictions Comments: watch BP Required Braces or Orthoses: Knee Immobilizer - Left (KI in room) Knee Immobilizer - Left: On when out of bed or walking;Discontinue once straight leg raise with < 10 degree lag Restrictions Weight Bearing Restrictions Per Provider Order: Yes LLE Weight Bearing Per Provider Order: Weight bearing as tolerated     Mobility Bed Mobility               General bed mobility comments: oob standing on arrival    Transfers Overall transfer level: Needs  assistance Equipment used: Rolling walker (2 wheels) Transfers: Sit to/from Stand Sit to Stand: Mod assist           General transfer comment: heavy use and reliance on arms      Balance Overall balance assessment: Mild deficits observed, not formally tested                                         ADL either performed or assessed with clinical judgement   ADL Overall ADL's : Needs assistance/impaired Eating/Feeding: Set up   Grooming: Set up           Upper Body Dressing : Minimal assistance   Lower Body Dressing: Moderate assistance Lower Body Dressing Details (indicate cue type and reason): pt is able to foward flex and touch ankle of L LE. pt has a reacher at home and educated on AE to (A) with LB dressing. pt educated to dress LLE first Toilet Transfer: Moderate assistance Toilet Transfer Details (indicate cue type and reason): pt requires mod (A) from chair with bil UE on arm rest and increased time. pt reports dizziness           General ADL Comments: pt oob to chair with PT on arrival. pt reports increase pain compared to evening ambulation in the KI. Pt reports decreased pain medication as pt was concerned he would become sick taking it. Pt noted to have change in BP noted  below     Vision Patient Visual Report: No change from baseline       Perception         Praxis         Pertinent Vitals/Pain Pain Assessment Pain Assessment: Faces Faces Pain Scale: Hurts whole lot Pain Location: L knee area Pain Descriptors / Indicators: Discomfort, Grimacing, Operative site guarding Pain Intervention(s): Monitored during session, Repositioned, Limited activity within patient's tolerance, Patient requesting pain meds-RN notified, RN gave pain meds during session     Extremity/Trunk Assessment Upper Extremity Assessment Upper Extremity Assessment: Right hand dominant;LUE deficits/detail LUE Deficits / Details: baseline lymphedema. pt reports  he does not wear a sleeve and does not use his machine that often due to making pain in his hand   Lower Extremity Assessment Lower Extremity Assessment: Defer to PT evaluation;LLE deficits/detail LLE Deficits / Details: pt noted to sit with external hip rotation. recommendation for bone foam for home positioning in neutral   Cervical / Trunk Assessment Cervical / Trunk Assessment: Normal   Communication Communication Communication: No apparent difficulties   Cognition Arousal: Alert Behavior During Therapy: WFL for tasks assessed/performed Cognition: No apparent impairments                               Following commands: Intact       Cueing  General Comments      BP sitting 120/81 HR112 , standing 109/81 HR 135 dizziness nausea and closing eyes so returned to sitting , 3 minutes sitting 128/85 (100) HR 105   Exercises Exercises: General Lower Extremity General Exercises - Lower Extremity Heel Slides: AAROM, Left, 5 reps, Seated   Shoulder Instructions      Home Living Family/patient expects to be discharged to:: Private residence Living Arrangements: Spouse/significant other;Children Available Help at Discharge: Family;Available 24 hours/day Type of Home: House Home Access: Stairs to enter [employer] of Steps: 3   Home Layout: One level     Bathroom Shower/Tub: Chief Strategy Officer: Handicapped height     Home Equipment: Agricultural consultant (2 wheels);Hand held shower head;Adaptive equipment Adaptive Equipment: Reacher Additional Comments: x2 dogs that do sit with him and one that can jump into his lap. wife can help manage animals. pt reports being able to borrow a w/c from son in law if needed.      Prior Functioning/Environment Prior Level of Function : Independent/Modified Independent;Driving (retired)               ADLs Comments: indep    OT Problem List: Decreased strength;Decreased activity  tolerance;Impaired balance (sitting and/or standing);Decreased knowledge of precautions;Decreased knowledge of use of DME or AE;Decreased safety awareness;Cardiopulmonary status limiting activity;Obesity;Pain   OT Treatment/Interventions: Self-care/ADL training;Therapeutic exercise;Energy conservation;DME and/or AE instruction;Manual therapy;Modalities;Therapeutic activities;Patient/family education;Balance training      OT Goals(Current goals can be found in the care plan section)   Acute Rehab OT Goals Patient Stated Goal: to go home OT Goal Formulation: With patient/family Time For Goal Achievement: 10/29/23 Potential to Achieve Goals: Good   OT Frequency:  Min 3X/week    Co-evaluation              AM-PAC OT "6 Clicks" Daily Activity     Outcome Measure Help from another person eating meals?: A Little Help from another person taking care of personal grooming?: A Little Help from another person toileting, which includes using toliet, bedpan, or urinal?: A Lot  Help from another person bathing (including washing, rinsing, drying)?: A Lot Help from another person to put on and taking off regular upper body clothing?: A Little Help from another person to put on and taking off regular lower body clothing?: A Lot 6 Click Score: 15   End of Session Equipment Utilized During Treatment: Rolling walker (2 wheels) Nurse Communication: Mobility status;Precautions  Activity Tolerance: Patient limited by pain Patient left: in chair;with call bell/phone within reach;with family/visitor present  OT Visit Diagnosis: Unsteadiness on feet (R26.81);Muscle weakness (generalized) (M62.81)                Time: 4010-2725 OT Time Calculation (min): 42 min Charges:  OT General Charges $OT Visit: 1 Visit OT Evaluation $OT Eval Moderate Complexity: 1 Mod OT Treatments $Self Care/Home Management : 8-22 mins   Brynn, OTR/L  Acute Rehabilitation Services Office: [phone] .   Mateo Flow 10/15/2023, 10:43 AM

## 2023-10-15 NOTE — Care Management Obs Status (Signed)
 MEDICARE OBSERVATION STATUS NOTIFICATION   Patient Details  Name: CULVER FEIGHNER MRN: [id number] Date of Birth: [birth year]   Medicare Observation Status Notification Given:  Yes    Kizzie Ide Rojean Ige, RN 10/15/2023, 3:10 PM

## 2023-10-15 NOTE — Evaluation (Signed)
 Physical Therapy Evaluation Patient Details Name: Luis Fitzgerald MRN: [id number] DOB: [birth year] Today's Date: 10/15/2023  History of Present Illness  68 yo s/p 3/18 L TKA PMH melanoma, DM, lymphedema L arm  Clinical Impression  PTA pt living with wife in single story home with 3 steps to enter. Pt independent with ambulation, ADLs and iADLs. Pt is currently limited in safe mobility by L knee pain, dizziness with standing and decreased strength and ROM associated with TKA. Pt is min A for bed mobility and modA for transfers and taking steps away from bed. PT recommending HHPT at discharge if pt able to progress once orthostatic hypotension resolved. PT will continue to follow acutely.         If plan is discharge home, recommend the following: A little help with bathing/dressing/bathroom;A lot of help with walking and/or transfers;Direct supervision/assist for medications management;Direct supervision/assist for financial management;Assist for transportation;Help with stairs or ramp for entrance   Can travel by private vehicle    Yes    Equipment Recommendations BSC/3in1 (wide 3in1 tub bench)     Functional Status Assessment Patient has had a recent decline in their functional status and demonstrates the ability to make significant improvements in function in a reasonable and predictable amount of time.     Precautions / Restrictions Precautions Precautions: Knee;Fall Precaution/Restrictions Comments: watch BP Required Braces or Orthoses: Knee Immobilizer - Left (KI in room) Knee Immobilizer - Left: On when out of bed or walking;Discontinue once straight leg raise with < 10 degree lag Restrictions Weight Bearing Restrictions Per Provider Order: Yes LLE Weight Bearing Per Provider Order: Weight bearing as tolerated      Mobility  Bed Mobility Overal bed mobility: Needs Assistance Bed Mobility: Supine to Sit     Supine to sit: HOB elevated, Used rails, Min assist      General bed mobility comments: pt able to manage LE off bed but requires min A for bringin trunk to upright and pad scoot to square hips to bed.    Transfers Overall transfer level: Needs assistance Equipment used: Rolling walker (2 wheels) Transfers: Sit to/from Stand, Bed to chair/wheelchair/BSC Sit to Stand: Mod assist   Step pivot transfers: Min assist       General transfer comment: modA to assist with coming to upright and steadying, vc for sequencing and upright trunk, after sitting 3 minA for step pivot to sit up in recliner    Ambulation/Gait Ambulation/Gait assistance: Mod assist Gait Distance (Feet): 3 Feet Assistive device: Rolling walker (2 wheels) Gait Pattern/deviations: Step-to pattern, Trunk flexed, Decreased stance time - left, Decreased weight shift to left Gait velocity: slowed Gait velocity interpretation: <1.31 ft/sec, indicative of household ambulator Pre-gait activities: marching in place General Gait Details: pt takes a few steps away from bed and reports he needs to sit back down, pt needing modA for support        Balance Overall balance assessment: Mild deficits observed, not formally tested                                           Pertinent Vitals/Pain Pain Assessment Pain Assessment: Faces Faces Pain Scale: Hurts whole lot Pain Location: L knee area Pain Descriptors / Indicators: Discomfort, Grimacing, Operative site guarding Pain Intervention(s): Limited activity within patient's tolerance, Monitored during session, Repositioned    Home Living Family/patient expects to be discharged to::  Private residence Living Arrangements: Spouse/significant other;Children Available Help at Discharge: Family;Available 24 hours/day Type of Home: House Home Access: Stairs to enter   [employer] of Steps: 3   Home Layout: One level Home Equipment: Agricultural consultant (2 wheels);Hand held shower head;Adaptive  equipment Additional Comments: x2 dogs that do sit with him and one that can jump into his lap. wife can help manage animals. pt reports being able to borrow a w/c from son in law if needed.    Prior Function Prior Level of Function : Independent/Modified Independent;Driving (retired)               ADLs Comments: indep     Extremity/Trunk Assessment   Upper Extremity Assessment Upper Extremity Assessment: Defer to OT evaluation LUE Deficits / Details: baseline lymphedema. pt reports he does not wear a sleeve and does not use his machine that often due to making pain in his hand    Lower Extremity Assessment Lower Extremity Assessment: LLE deficits/detail;RLE deficits/detail RLE Deficits / Details: generalized weakness LLE Deficits / Details: pt with ER of hip on entry, painful to come to neutral positioning. Knee lacking 8 degrees extension and knee flexion 115 degree. able to perform SLR during bed exercise. LLE Sensation: WNL LLE Coordination: decreased fine motor;decreased gross motor    Cervical / Trunk Assessment Cervical / Trunk Assessment: Normal  Communication   Communication Communication: No apparent difficulties    Cognition Arousal: Alert Behavior During Therapy: WFL for tasks assessed/performed                             Following commands: Intact       Cueing Cueing Techniques: Verbal cues, Visual cues, Tactile cues     General Comments General comments (skin integrity, edema, etc.): lightheadedness with standing    Exercises General Exercises - Lower Extremity Ankle Circles/Pumps: AROM, Both, 10 reps, Supine Quad Sets: AROM, Both, 10 reps, Supine Short Arc Quad: AROM, Both, 10 reps, Supine Heel Slides: Left, 5 reps, Supine, AAROM Hip ABduction/ADduction: AROM, Left, Supine Straight Leg Raises: AROM, AAROM, Left, 10 reps, Supine   Assessment/Plan    PT Assessment Patient needs continued PT services  PT Problem List Decreased  strength;Decreased range of motion;Decreased activity tolerance;Decreased balance;Decreased mobility;Decreased coordination;Cardiopulmonary status limiting activity;Pain       PT Treatment Interventions DME instruction;Gait training;Stair training;Functional mobility training;Therapeutic activities;Therapeutic exercise;Balance training;Cognitive remediation;Patient/family education    PT Goals (Current goals can be found in the Care Plan section)  Acute Rehab PT Goals PT Goal Formulation: With patient/family Time For Goal Achievement: 10/29/23 Potential to Achieve Goals: Good    Frequency BID        AM-PAC PT "6 Clicks" Mobility  Outcome Measure Help needed turning from your back to your side while in a flat bed without using bedrails?: A Little Help needed moving from lying on your back to sitting on the side of a flat bed without using bedrails?: A Little Help needed moving to and from a bed to a chair (including a wheelchair)?: A Lot Help needed standing up from a chair using your arms (e.g., wheelchair or bedside chair)?: A Lot Help needed to walk in hospital room?: A Lot Help needed climbing 3-5 steps with a railing? : Total 6 Click Score: 13    End of Session Equipment Utilized During Treatment: Gait belt Activity Tolerance: Treatment limited secondary to medical complications (Comment) (dizziness with standing) Patient left: in chair;with family/visitor  present;Other (comment) (OT in room) Nurse Communication: Mobility status;Other (comment) (not able to walk due to pain and lightheadedness) PT Visit Diagnosis: Unsteadiness on feet (R26.81);Other abnormalities of gait and mobility (R26.89);Muscle weakness (generalized) (M62.81);Difficulty in walking, not elsewhere classified (R26.2);Dizziness and giddiness (R42);Pain Pain - Right/Left: Left Pain - part of body: Knee    Time: 0910-0951 PT Time Calculation (min) (ACUTE ONLY): 41 min   Charges:   PT Evaluation $PT Eval  Moderate Complexity: 1 Mod PT Treatments $Therapeutic Exercise: 8-22 mins $Therapeutic Activity: 8-22 mins PT General Charges $$ ACUTE PT VISIT: 1 Visit         Icelynn Onken B. Beverely Risen PT, DPT Acute Rehabilitation Services Please use secure chat or  Call Office [phone]   [facility]on Alas [facility] 10/15/2023, 12:37 PM

## 2023-10-15 NOTE — Progress Notes (Signed)
 Patient to transfer to [address]. Report given to [employer]. Patient in no signs of distress at this time. Pain medication given prior to transfer.  Patient was transferred in a bed, with spouse and belongings Designer, jewellery Man) with patient.

## 2023-10-15 NOTE — Anesthesia Postprocedure Evaluation (Signed)
 Anesthesia Post Note  Patient: Luis Fitzgerald  Procedure(s) Performed: LEFT TOTAL KNEE ARTHROPLASTY (Left: Knee)     Patient location during evaluation: PACU Anesthesia Type: Spinal Level of consciousness: awake and alert Pain management: pain level controlled Vital Signs Assessment: post-procedure vital signs reviewed and stable Respiratory status: spontaneous breathing and respiratory function stable Cardiovascular status: blood pressure returned to baseline and stable Postop Assessment: spinal receding Anesthetic complications: no   No notable events documented.  Last Vitals:  Vitals:   10/15/23 0520 10/15/23 0748  BP: 112/77 119/62  Pulse: (!) 101 100  Resp: 18 18  Temp: 37.1 C 36.8 C  SpO2: 94% 97%    Last Pain:  Vitals:   10/15/23 1026  TempSrc:   PainSc: 6                  [name]

## 2023-10-15 NOTE — Progress Notes (Signed)
 Subjective: 1 Day Post-Op Procedure(s) (LRB): LEFT TOTAL KNEE ARTHROPLASTY (Left) Patient reports pain as moderate.    Objective: Vital signs in last 24 hours: Temp:  [97.5 F (36.4 C)-98.9 F (37.2 C)] 98.2 F (36.8 C) (03/19 0748) Pulse Rate:  [55-101] 100 (03/19 0748) Resp:  [10-22] 18 (03/19 0748) BP: (95-157)/(62-89) 119/62 (03/19 0748) SpO2:  [94 %-100 %] 97 % (03/19 0748) Weight:  [100.2 kg] 100.2 kg (03/18 0926)  Intake/Output from previous day: 03/18 0701 - 03/19 0700 In: 1620 [P.O.:720; I.V.:800; IV Piggyback:100] Out: 2045 [Urine:2025; Blood:20] Intake/Output this shift: No intake/output data recorded.  Recent Labs    10/15/23 0643  HGB 13.1   Recent Labs    10/15/23 0643  WBC 10.9*  RBC 3.96*  HCT 37.2*  PLT 249   Recent Labs    10/15/23 0643  NA 132*  K 4.1  CL 101  CO2 26  BUN 12  CREATININE 0.98  GLUCOSE 221*  CALCIUM 8.2*   No results for input(s): "LABPT", "INR" in the last 72 hours.  Sensation intact distally Intact pulses distally Dorsiflexion/Plantar flexion intact Incision: dressing C/D/I Compartment soft   Assessment/Plan: 1 Day Post-Op Procedure(s) (LRB): LEFT TOTAL KNEE ARTHROPLASTY (Left) Up with therapy Discharge home with home health      Kathryne Hitch 10/15/2023, 7:51 AM

## 2023-10-15 NOTE — Progress Notes (Signed)
 Physical Therapy Treatment Patient Details Name: Luis Fitzgerald MRN: [id number] DOB: 1956-01-17 Today's Date: 10/15/2023   History of Present Illness 68 yo s/p 3/18 L TKA PMH melanoma, DM, lymphedema L arm    PT Comments  Pt making better progress this PM with mobility. Pt able to amb in hall with assist and orthostatics were ok (see vitals flowsheet) and pt denied any dizziness or lightheadedness. Pt continues to struggle with knee extension for ROM and I suspect after talking with him that his knee extension as far as ROM goes may have been limited prior to surgery. Expect it may take him longer to get adequate knee ROM. Will need to practice stairs prior to return home.     If plan is discharge home, recommend the following: A little help with bathing/dressing/bathroom;A lot of help with walking and/or transfers;Direct supervision/assist for medications management;Direct supervision/assist for financial management;Assist for transportation;Help with stairs or ramp for entrance   Can travel by private vehicle        Equipment Recommendations  BSC/3in1 (wide 3in1 tub bench)    Recommendations for Other Services       Precautions / Restrictions Precautions Precautions: Knee;Fall Precaution/Restrictions Comments: watch BP Required Braces or Orthoses: Knee Immobilizer - Left (KI in room) Knee Immobilizer - Left: On when out of bed or walking;Discontinue once straight leg raise with < 10 degree lag Restrictions Weight Bearing Restrictions Per Provider Order: Yes LLE Weight Bearing Per Provider Order: Weight bearing as tolerated     Mobility  Bed Mobility Overal bed mobility: Needs Assistance Bed Mobility: Supine to Sit     Supine to sit: HOB elevated, Used rails, Min assist     General bed mobility comments: Assist under lt heel to bring LLE off of bed    Transfers Overall transfer level: Needs assistance Equipment used: Rolling walker (2 wheels) Transfers: Sit to/from  Stand Sit to Stand: Min assist           General transfer comment: Assist to power up and stabilize    Ambulation/Gait Ambulation/Gait assistance: Contact guard assist Gait Distance (Feet): 125 Feet Assistive device: Rolling walker (2 wheels) Gait Pattern/deviations: Trunk flexed, Decreased stance time - left, Decreased weight shift to left, Step-through pattern Gait velocity: decr Gait velocity interpretation: 1.31 - 2.62 ft/sec, indicative of limited community ambulator   General Gait Details: Assist for safety. Knee immobilizer on. After sitting rest amb 15' in room without knee immobilizer. No buckling of knee noted.   Stairs             Wheelchair Mobility     Tilt Bed    Modified Rankin (Stroke Patients Only)       Balance Overall balance assessment: Mild deficits observed, not formally tested                                          Communication Communication Communication: No apparent difficulties  Cognition Arousal: Alert Behavior During Therapy: WFL for tasks assessed/performed   PT - Cognitive impairments: No apparent impairments                         Following commands: Intact      Cueing Cueing Techniques: Verbal cues, Visual cues, Tactile cues  Exercises Total Joint Exercises Quad Sets: AROM, Left, 10 reps, Supine Heel Slides: AAROM, Left, 10 reps, Supine  Long Arc Quad: AAROM, Left, 10 reps, Seated Knee Flexion: AAROM, Left, 10 reps, Seated Goniometric ROM: 10-80 degrees active assistive    General Comments General comments (skin integrity, edema, etc.): Lying BP 145/93, Seated BP 129/92, Standing BP 122/79      Pertinent Vitals/Pain Pain Assessment Pain Assessment: Faces Faces Pain Scale: Hurts whole lot Pain Location: L knee area Pain Descriptors / Indicators: Discomfort, Grimacing, Operative site guarding Pain Intervention(s): Limited activity within patient's tolerance, Monitored during session     Home Living                          Prior Function            PT Goals (current goals can now be found in the care plan section) Progress towards PT goals: Progressing toward goals    Frequency    7X/week      PT Plan      Co-evaluation              AM-PAC PT "6 Clicks" Mobility   Outcome Measure  Help needed turning from your back to your side while in a flat bed without using bedrails?: A Little Help needed moving from lying on your back to sitting on the side of a flat bed without using bedrails?: A Little Help needed moving to and from a bed to a chair (including a wheelchair)?: A Little Help needed standing up from a chair using your arms (e.g., wheelchair or bedside chair)?: A Little Help needed to walk in hospital room?: A Little Help needed climbing 3-5 steps with a railing? : Total 6 Click Score: 16    End of Session Equipment Utilized During Treatment: Gait belt Activity Tolerance: Patient tolerated treatment well Patient left: in chair;with family/visitor present;with call bell/phone within reach Nurse Communication: Mobility status PT Visit Diagnosis: Unsteadiness on feet (R26.81);Other abnormalities of gait and mobility (R26.89);Muscle weakness (generalized) (M62.81);Difficulty in walking, not elsewhere classified (R26.2);Dizziness and giddiness (R42);Pain Pain - Right/Left: Left Pain - part of body: Knee     Time: 1426-1510 PT Time Calculation (min) (ACUTE ONLY): 44 min  Charges:    $Gait Training: 23-37 mins $Therapeutic Exercise: 8-22 mins PT General Charges $$ ACUTE PT VISIT: 1 Visit                     [facility] PT Acute Rehabilitation Services Office [phone]    Angelina Ok [facility] 10/15/2023, 3:24 PM

## 2023-10-16 ENCOUNTER — Other Ambulatory Visit: Payer: Self-pay

## 2023-10-16 DIAGNOSIS — M1712 Unilateral primary osteoarthritis, left knee: Secondary | ICD-10-CM | POA: Diagnosis not present

## 2023-10-16 DIAGNOSIS — Z7984 Long term (current) use of oral hypoglycemic drugs: Secondary | ICD-10-CM | POA: Diagnosis not present

## 2023-10-16 DIAGNOSIS — Z79899 Other long term (current) drug therapy: Secondary | ICD-10-CM | POA: Diagnosis not present

## 2023-10-16 DIAGNOSIS — E119 Type 2 diabetes mellitus without complications: Secondary | ICD-10-CM | POA: Diagnosis not present

## 2023-10-16 DIAGNOSIS — Z85828 Personal history of other malignant neoplasm of skin: Secondary | ICD-10-CM | POA: Diagnosis not present

## 2023-10-16 LAB — GLUCOSE, CAPILLARY
Glucose-Capillary: 193 mg/dL — ABNORMAL HIGH (ref 70–99)
Glucose-Capillary: 204 mg/dL — ABNORMAL HIGH (ref 70–99)
Glucose-Capillary: 220 mg/dL — ABNORMAL HIGH (ref 70–99)

## 2023-10-16 MED ORDER — ONDANSETRON HCL 4 MG PO TABS: 4.0000 mg | ORAL_TABLET | Freq: Four times a day (QID) | ORAL | 0 refills | Status: DC | PRN

## 2023-10-16 NOTE — Progress Notes (Signed)
 Physical Therapy Treatment Patient Details Name: Luis Fitzgerald MRN: [id number] DOB: [birth year] Today's Date: 10/16/2023   History of Present Illness 68 yo s/p 3/18 L TKA PMH melanoma, DM, lymphedema L arm    PT Comments  Patient is feeling much better now. He reports only 2/10 pain in the left knee. Increased independence with transfers and ambulation. Stair training completed. Blood pressure monitored during session with no reported dizziness with mobility. The patient is eager to be discharged home. Mobility is adequate for discharge home with family support. PT will continue to follow while in the hospital.    If plan is discharge home, recommend the following: A little help with bathing/dressing/bathroom;A lot of help with walking and/or transfers;Direct supervision/assist for medications management;Direct supervision/assist for financial management;Assist for transportation;Help with stairs or ramp for entrance   Can travel by private vehicle        Equipment Recommendations  BSC/3in1 (wide 3 in 1 tube bench)    Recommendations for Other Services       Precautions / Restrictions Precautions Precautions: Knee;Fall Precaution/Restrictions Comments: watch BP Required Braces or Orthoses: Knee Immobilizer - Left Knee Immobilizer - Left: On when out of bed or walking;Discontinue once straight leg raise with < 10 degree lag Restrictions Weight Bearing Restrictions Per Provider Order: Yes LLE Weight Bearing Per Provider Order: Weight bearing as tolerated     Mobility  Bed Mobility               General bed mobility comments: not assessed, patient in the chair    Transfers Overall transfer level: Needs assistance Equipment used: Rolling walker (2 wheels) Transfers: Sit to/from Stand Sit to Stand: Supervision           General transfer comment: 2 standing bouts from the chair with no physical assistance required. reinforcement of safe hand technique     Ambulation/Gait Ambulation/Gait assistance: Supervision Gait Distance (Feet): 120 Feet Assistive device: Rolling walker (2 wheels) (knee immobilizer in place with walking (patient unable to perform SLR while sitting in chair).) Gait Pattern/deviations: Trunk flexed, Decreased stance time - left, Decreased weight shift to left, Step-through pattern Gait velocity: decreased     General Gait Details: no physical assistance required for hallway ambulation. no dizziness is reported with mobility.   Stairs Stairs: Yes Stairs assistance: Supervision Stair Management: One rail Left, Step to pattern, Sideways Number of Stairs: 2 General stair comments: patient went up/down 2 steps with rail on the left going up to simulate home entry. visual and verbal instruction provided for sequencing with carry over demonstrated   Wheelchair Mobility     Tilt Bed    Modified Rankin (Stroke Patients Only)       Balance Overall balance assessment: Mild deficits observed, not formally tested                                          Communication Communication Communication: No apparent difficulties  Cognition Arousal: Alert Behavior During Therapy: WFL for tasks assessed/performed                             Following commands: Intact      Cueing Cueing Techniques: Verbal cues  Exercises Total Joint Exercises Goniometric ROM: L knee 10-75 degrees    General Comments General comments (skin integrity, edema, etc.): blood pressure monitored throughout  session with no dizziness reported with mobility. sitting BO 127/92 and standing BP 112/85      Pertinent Vitals/Pain Pain Assessment Pain Assessment: 0-10 Pain Score: 2  Pain Location: L knee area Pain Descriptors / Indicators: Discomfort Pain Intervention(s): Limited activity within patient's tolerance, Monitored during session, Repositioned (polar care re-applied)    Home Living                           Prior Function            PT Goals (current goals can now be found in the care plan section) Acute Rehab PT Goals Patient Stated Goal: to go home PT Goal Formulation: With patient/family Time For Goal Achievement: 10/29/23 Potential to Achieve Goals: Good Progress towards PT goals: Progressing toward goals    Frequency    7X/week      PT Plan      Co-evaluation              AM-PAC PT "6 Clicks" Mobility   Outcome Measure  Help needed turning from your back to your side while in a flat bed without using bedrails?: None Help needed moving from lying on your back to sitting on the side of a flat bed without using bedrails?: A Little Help needed moving to and from a bed to a chair (including a wheelchair)?: A Little Help needed standing up from a chair using your arms (e.g., wheelchair or bedside chair)?: A Little Help needed to walk in hospital room?: A Little Help needed climbing 3-5 steps with a railing? : A Little 6 Click Score: 19    End of Session Equipment Utilized During Treatment: Gait belt Activity Tolerance: Patient tolerated treatment well Patient left: in chair;with call bell/phone within reach;with nursing/sitter in room Nurse Communication: Mobility status PT Visit Diagnosis: Unsteadiness on feet (R26.81);Other abnormalities of gait and mobility (R26.89);Muscle weakness (generalized) (M62.81);Difficulty in walking, not elsewhere classified (R26.2);Dizziness and giddiness (R42);Pain Pain - Right/Left: Left Pain - part of body: Knee     Time: 1005-1045 PT Time Calculation (min) (ACUTE ONLY): 40 min  Charges:    $Gait Training: 23-37 mins $Therapeutic Activity: 8-22 mins PT General Charges $$ ACUTE PT VISIT: 1 Visit                    Donna Bernard, PT, MPT   Ina Homes 10/16/2023, 11:40 AM

## 2023-10-16 NOTE — Progress Notes (Signed)
 PT Cancellation Note  Patient Details Name: Luis Fitzgerald MRN: [id number] DOB: 10/12/1955   Cancelled Treatment:    Reason Eval/Treat Not Completed: Other (comment) (Patient reports feeling nauseous and woozy, just finished walking in the hallway with OT. PT will come back later this morning.)  Donna Bernard, PT, MPT  Ina Homes 10/16/2023, 8:50 AM

## 2023-10-16 NOTE — Plan of Care (Signed)
 Pt alert and oriented x 4. Up with assist. Ice man on left knee. Dsg intact. Vitals stable. Received 3 doses of oxy and 1 dose of robaxin this shift.  Problem: Education: Goal: Knowledge of General Education information will improve Description: Including pain rating scale, medication(s)/side effects and non-pharmacologic comfort measures Outcome: Progressing   Problem: Health Behavior/Discharge Planning: Goal: Ability to manage health-related needs will improve Outcome: Progressing   Problem: Clinical Measurements: Goal: Ability to maintain clinical measurements within normal limits will improve Outcome: Progressing Goal: Will remain free from infection Outcome: Progressing Goal: Diagnostic test results will improve Outcome: Progressing Goal: Respiratory complications will improve Outcome: Progressing Goal: Cardiovascular complication will be avoided Outcome: Progressing   Problem: Activity: Goal: Risk for activity intolerance will decrease Outcome: Progressing   Problem: Nutrition: Goal: Adequate nutrition will be maintained Outcome: Progressing   Problem: Coping: Goal: Level of anxiety will decrease Outcome: Progressing   Problem: Elimination: Goal: Will not experience complications related to bowel motility Outcome: Progressing Goal: Will not experience complications related to urinary retention Outcome: Progressing   Problem: Pain Managment: Goal: General experience of comfort will improve and/or be controlled Outcome: Progressing   Problem: Safety: Goal: Ability to remain free from injury will improve Outcome: Progressing   Problem: Skin Integrity: Goal: Risk for impaired skin integrity will decrease Outcome: Progressing   Problem: Education: Goal: Knowledge of the prescribed therapeutic regimen will improve Outcome: Progressing Goal: Individualized Educational Video(s) Outcome: Progressing   Problem: Activity: Goal: Ability to avoid complications of  mobility impairment will improve Outcome: Progressing Goal: Range of joint motion will improve Outcome: Progressing   Problem: Clinical Measurements: Goal: Postoperative complications will be avoided or minimized Outcome: Progressing   Problem: Pain Management: Goal: Pain level will decrease with appropriate interventions Outcome: Progressing   Problem: Skin Integrity: Goal: Will show signs of wound healing Outcome: Progressing

## 2023-10-16 NOTE — Progress Notes (Signed)
 Occupational Therapy Treatment Patient Details Name: Luis HASEGAWA MRN: [id number] DOB: [birth year] Today's Date: 10/16/2023   History of present illness 68 yo s/p 3/18 L TKA PMH melanoma, DM, lymphedema L arm   OT comments  Patient received in recliner stating he felt better and was able to walk to bathroom. Patient did not have KI on and was assisted with donning on LLE. Patient's BP while seated in recliner 129/88(100). Patient able to stand from recliner with min/CGA and BP was 116/80(92). Patient able to ambulate to bathroom for grooming tasks and ambulate in room and hallway before returning to recliner with BP 132/82(98). Discussed tub bench for shower transfers and how to setup and perform. Discharge recommendations continue to be appropriate. Acute OT to continue to follow to address established goals to facilitate safe DC home.       If plan is discharge home, recommend the following:  A lot of help with walking and/or transfers;A lot of help with bathing/dressing/bathroom   Equipment Recommendations  BSC/3in1 (will need to further assess bariatric width BSC and bench for shower)    Recommendations for Other Services      Precautions / Restrictions Precautions Precautions: Knee;Fall Precaution/Restrictions Comments: watch BP Required Braces or Orthoses: Knee Immobilizer - Left Knee Immobilizer - Left: On when out of bed or walking;Discontinue once straight leg raise with < 10 degree lag Restrictions Weight Bearing Restrictions Per Provider Order: Yes LLE Weight Bearing Per Provider Order: Weight bearing as tolerated       Mobility Bed Mobility Overal bed mobility: Needs Assistance             General bed mobility comments: OOB in recliner    Transfers Overall transfer level: Needs assistance Equipment used: Rolling walker (2 wheels) Transfers: Sit to/from Stand Sit to Stand: Min assist           General transfer comment: min to CGA to stand from  recliner and CGA for transfers, patient demonstrated good hand placement and safety     Balance Overall balance assessment: Mild deficits observed, not formally tested                                         ADL either performed or assessed with clinical judgement   ADL Overall ADL's : Needs assistance/impaired     Grooming: Wash/dry hands;Wash/dry face;Oral care;Supervision/safety;Set up;Standing Grooming Details (indicate cue type and reason): at ink         Upper Body Dressing : Set up;Sitting Upper Body Dressing Details (indicate cue type and reason): donn gown for back Lower Body Dressing: Moderate assistance   Toilet Transfer: Minimal assistance;Contact guard assist;Rolling walker (2 wheels) Toilet Transfer Details (indicate cue type and reason): min to CGA to stand from recliner, CGA and verbal cues for transfers       Tub/Shower Transfer Details (indicate cue type and reason): Education on tub bench   General ADL Comments: OOB in recliner, states he was able to ambulate to bathroom earlier    Extremity/Trunk Assessment              Vision       Perception     Praxis     Communication Communication Communication: No apparent difficulties   Cognition Arousal: Alert Behavior During Therapy: [facility] for tasks assessed/performed Cognition: No apparent impairments  Following commands: Intact        Cueing   Cueing Techniques: Verbal cues  Exercises      Shoulder Instructions       General Comments BP seated in recliner 129/88 (100), standing at recliner 116/80(92), after mobility seated in recliner 132/82(98)    Pertinent Vitals/ Pain       Pain Assessment Pain Assessment: Faces Faces Pain Scale: Hurts little more Pain Location: L knee area Pain Descriptors / Indicators: Discomfort, Grimacing, Operative site guarding Pain Intervention(s): Limited activity within patient's tolerance,  Monitored during session, Repositioned  Home Living                                          Prior Functioning/Environment              Frequency  Min 3X/week        Progress Toward Goals  OT Goals(current goals can now be found in the care plan section)  Progress towards OT goals: Progressing toward goals  Acute Rehab OT Goals Patient Stated Goal: to go home OT Goal Formulation: With patient/family Time For Goal Achievement: 10/29/23 Potential to Achieve Goals: Good ADL Goals Pt Will Perform Lower Body Dressing: with modified independence;with adaptive equipment;sit to/from stand Pt Will Transfer to Toilet: with modified independence;ambulating;bedside commode Additional ADL Goal #1: pt will complete bed mobility mod I  Plan      Co-evaluation                 AM-PAC OT "6 Clicks" Daily Activity     Outcome Measure   Help from another person eating meals?: A Little Help from another person taking care of personal grooming?: A Little Help from another person toileting, which includes using toliet, bedpan, or urinal?: A Little Help from another person bathing (including washing, rinsing, drying)?: A Lot Help from another person to put on and taking off regular upper body clothing?: A Little Help from another person to put on and taking off regular lower body clothing?: A Lot 6 Click Score: 16    End of Session Equipment Utilized During Treatment: Gait belt;Rolling walker (2 wheels)  OT Visit Diagnosis: Unsteadiness on feet (R26.81);Muscle weakness (generalized) (M62.81)   Activity Tolerance Patient tolerated treatment well   Patient Left in chair;with call bell/phone within reach;with family/visitor present   Nurse Communication Mobility status;Precautions        Time: 1914-7829 OT Time Calculation (min): 30 min  Charges: OT Treatments $Self Care/Home Management : 23-37 mins  Alfonse Flavors, OTA Acute Rehabilitation Services   Office [phone]   Dewain Penning 10/16/2023, 10:23 AM

## 2023-10-16 NOTE — Progress Notes (Signed)
 Subjective: 2 Days Post-Op Procedure(s) (LRB): LEFT TOTAL KNEE ARTHROPLASTY (Left) Patient reports pain as mild.  Some nausea this AM none currently . Wanting to discharge to home. Did steps earlier today.   Objective: Vital signs in last 24 hours: Temp:  [98 F (36.7 C)-99.5 F (37.5 C)] 98 F (36.7 C) (03/20 0806) Pulse Rate:  [102-115] 115 (03/20 0806) Resp:  [16-19] 19 (03/20 0806) BP: (110-142)/(69-88) 127/79 (03/20 0806) SpO2:  [94 %-100 %] 95 % (03/20 0806)  Intake/Output from previous day: 03/19 0701 - 03/20 0700 In: 743.6 [P.O.:240; I.V.:3.6; IV Piggyback:500] Out: -  Intake/Output this shift: Total I/O In: 240 [P.O.:240] Out: -   Recent Labs    10/15/23 0643  HGB 13.1   Recent Labs    10/15/23 0643  WBC 10.9*  RBC 3.96*  HCT 37.2*  PLT 249   Recent Labs    10/15/23 0643  NA 132*  K 4.1  CL 101  CO2 26  BUN 12  CREATININE 0.98  GLUCOSE 221*  CALCIUM 8.2*   No results for input(s): "LABPT", "INR" in the last 72 hours.  Sensation intact distally Dorsiflexion/Plantar flexion intact Incision: scant drainage Compartment soft   Assessment/Plan: 2 Days Post-Op Procedure(s) (LRB): LEFT TOTAL KNEE ARTHROPLASTY (Left) Discharge home with home health Will send in Zofran for nausea.     Luis Fitzgerald 10/16/2023, 12:17 PM

## 2023-10-16 NOTE — Discharge Summary (Signed)
 Patient ID: Luis Fitzgerald MRN: [id number] DOB/AGE: [birth year] 68 y.o.  Admit date: 10/14/2023 Discharge date: 10/16/2023  Admission Diagnoses:  Principal Problem:   Unilateral primary osteoarthritis, left knee Active Problems:   Status post total left knee replacement  Post op Nausea  Discharge Diagnoses:  Same  Past Medical History:  Diagnosis Date   Cancer (HCC)    melanoma   Diabetes mellitus without complication (HCC)    Lymphedema    left arm   Melanoma (HCC)    left arm    Surgeries: Procedure(s): LEFT TOTAL KNEE ARTHROPLASTY on 10/14/2023   Consultants:   Discharged Condition: Improved  Hospital Course: GATES JIVIDEN is an 68 y.o. male who was admitted 10/14/2023 for operative treatment ofUnilateral primary osteoarthritis, left knee. Patient has severe unremitting pain that affects sleep, daily activities, and work/hobbies. After pre-op clearance the patient was taken to the operating room on 10/14/2023 and underwent  Procedure(s): LEFT TOTAL KNEE ARTHROPLASTY.    Patient was given perioperative antibiotics:  Anti-infectives (From admission, onward)    Start     Dose/Rate Route Frequency Ordered Stop   10/14/23 1830  ceFAZolin (ANCEF) IVPB 2g/100 mL premix        2 g 200 mL/hr over 30 Minutes Intravenous Every 6 hours 10/14/23 1732 10/15/23 1302   10/14/23 0915  ceFAZolin (ANCEF) IVPB 2g/100 mL premix        2 g 200 mL/hr over 30 Minutes Intravenous On call to O.R. 10/14/23 0914 10/14/23 1229        Patient was given sequential compression devices, early ambulation, and chemoprophylaxis to prevent DVT.  Patient benefited maximally from hospital stay and there were no complications.    Recent vital signs: Patient Vitals for the past 24 hrs:  BP Temp Temp src Pulse Resp SpO2  10/16/23 1233 127/72 -- -- (!) 104 -- 95 %  10/16/23 1150 129/85 98.4 F (36.9 C) -- (!) 107 18 100 %  10/16/23 0806 127/79 98 F (36.7 C) -- (!) 115 19 95 %  10/16/23  0802 -- 98 F (36.7 C) -- (!) 105 19 94 %  10/16/23 0522 130/88 98.3 F (36.8 C) Oral (!) 103 16 97 %  10/15/23 2054 110/69 99.5 F (37.5 C) Oral (!) 102 18 100 %  10/15/23 1937 (!) 142/77 98.2 F (36.8 C) -- (!) 106 18 100 %  10/15/23 1606 129/84 98.4 F (36.9 C) Oral -- 17 97 %     Recent laboratory studies:  Recent Labs    10/15/23 0643  WBC 10.9*  HGB 13.1  HCT 37.2*  PLT 249  NA 132*  K 4.1  CL 101  CO2 26  BUN 12  CREATININE 0.98  GLUCOSE 221*  CALCIUM 8.2*     Discharge Medications:   Allergies as of 10/16/2023       Reactions   Other Other (See Comments)   Pecans -no other nuts        Medication List     TAKE these medications    acetaminophen 500 MG tablet Commonly known as: TYLENOL Take 500-1,000 mg by mouth every 8 (eight) hours as needed for mild pain (pain score 1-3) or moderate pain (pain score 4-6).   aspirin 81 MG chewable tablet Chew 1 tablet (81 mg total) by mouth 2 (two) times daily.   atorvastatin 10 MG tablet Commonly known as: LIPITOR Take 10 mg by mouth at bedtime.   celecoxib 200 MG capsule Commonly known as: CELEBREX Take  200 mg by mouth in the morning.   glipiZIDE 10 MG tablet Commonly known as: GLUCOTROL Take 5 mg by mouth 2 (two) times daily before a meal.   metFORMIN 500 MG 24 hr tablet Commonly known as: GLUCOPHAGE-XR Take 1,000 mg by mouth 2 (two) times daily with a meal.   ondansetron 4 MG tablet Commonly known as: ZOFRAN Take 1 tablet (4 mg total) by mouth every 6 (six) hours as needed for nausea.   oxyCODONE 5 MG immediate release tablet Commonly known as: Oxy IR/ROXICODONE Take 1-2 tablets (5-10 mg total) by mouth every 6 (six) hours as needed for moderate pain (pain score 4-6) (pain score 4-6). No more than 6 tablets daily   Ozempic (0.25 or 0.5 MG/DOSE) 2 MG/3ML Sopn Generic drug: Semaglutide(0.25 or 0.5MG /DOS) Inject 0.5 mg into the skin once a week.   tiZANidine 4 MG tablet Commonly known as:  Zanaflex Take 1 tablet (4 mg total) by mouth every 8 (eight) hours as needed for muscle spasms.               Durable Medical Equipment  (From admission, onward)           Start     Ordered   10/14/23 1733  DME 3 n 1  Once        10/14/23 1732   10/14/23 1733  DME Walker rolling  Once       Question Answer Comment  Walker: With 5 Inch Wheels   Patient needs a walker to treat with the following condition Status post total left knee replacement      10/14/23 1732            Diagnostic Studies: DG Knee Left Port Result Date: 10/14/2023 CLINICAL DATA:  Status post total left knee arthroplasty. EXAM: PORTABLE LEFT KNEE - 1-2 VIEW COMPARISON:  Left knee radiographs 08/12/2022 FINDINGS: Interval total left knee arthroplasty. No perihardware lucency is seen to indicate hardware failure or loosening. Expected postoperative changes including intra-articular and subcutaneous air. Smalljoint effusion. Anterior surgical skin staples. No acute fracture or dislocation. IMPRESSION: Interval total left knee arthroplasty without evidence of hardware failure. Electronically Signed   By: Neita Garnet M.D.   On: 10/14/2023 16:39    Disposition: Discharge disposition: 06-Home-Health Care Svc          Follow-up Information     Kathryne Hitch, MD Follow up in 2 week(s).   Specialty: Orthopedic Surgery Contact information: [address] [place] [address] [phone]         [facility] Follow up.   Why: [facility] will contact you for the first home visit Contact information: [phone]                 Signed: Richardean Canal 10/16/2023, 12:34 PM

## 2023-10-17 DIAGNOSIS — Z96652 Presence of left artificial knee joint: Secondary | ICD-10-CM | POA: Diagnosis not present

## 2023-10-17 DIAGNOSIS — Z7984 Long term (current) use of oral hypoglycemic drugs: Secondary | ICD-10-CM | POA: Diagnosis not present

## 2023-10-17 DIAGNOSIS — Z7982 Long term (current) use of aspirin: Secondary | ICD-10-CM | POA: Diagnosis not present

## 2023-10-17 DIAGNOSIS — Z8579 Personal history of other malignant neoplasms of lymphoid, hematopoietic and related tissues: Secondary | ICD-10-CM | POA: Diagnosis not present

## 2023-10-17 DIAGNOSIS — I890 Lymphedema, not elsewhere classified: Secondary | ICD-10-CM | POA: Diagnosis not present

## 2023-10-17 DIAGNOSIS — Z7985 Long-term (current) use of injectable non-insulin antidiabetic drugs: Secondary | ICD-10-CM | POA: Diagnosis not present

## 2023-10-17 DIAGNOSIS — Z471 Aftercare following joint replacement surgery: Secondary | ICD-10-CM | POA: Diagnosis not present

## 2023-10-17 DIAGNOSIS — E119 Type 2 diabetes mellitus without complications: Secondary | ICD-10-CM | POA: Diagnosis not present

## 2023-10-17 DIAGNOSIS — Z85830 Personal history of malignant neoplasm of bone: Secondary | ICD-10-CM | POA: Diagnosis not present

## 2023-10-17 DIAGNOSIS — Z791 Long term (current) use of non-steroidal anti-inflammatories (NSAID): Secondary | ICD-10-CM | POA: Diagnosis not present

## 2023-10-20 ENCOUNTER — Telehealth: Payer: Self-pay | Admitting: Orthopaedic Surgery

## 2023-10-20 DIAGNOSIS — Z85830 Personal history of malignant neoplasm of bone: Secondary | ICD-10-CM | POA: Diagnosis not present

## 2023-10-20 DIAGNOSIS — Z7982 Long term (current) use of aspirin: Secondary | ICD-10-CM | POA: Diagnosis not present

## 2023-10-20 DIAGNOSIS — Z7985 Long-term (current) use of injectable non-insulin antidiabetic drugs: Secondary | ICD-10-CM | POA: Diagnosis not present

## 2023-10-20 DIAGNOSIS — Z7984 Long term (current) use of oral hypoglycemic drugs: Secondary | ICD-10-CM | POA: Diagnosis not present

## 2023-10-20 DIAGNOSIS — E119 Type 2 diabetes mellitus without complications: Secondary | ICD-10-CM | POA: Diagnosis not present

## 2023-10-20 DIAGNOSIS — Z8579 Personal history of other malignant neoplasms of lymphoid, hematopoietic and related tissues: Secondary | ICD-10-CM | POA: Diagnosis not present

## 2023-10-20 DIAGNOSIS — Z791 Long term (current) use of non-steroidal anti-inflammatories (NSAID): Secondary | ICD-10-CM | POA: Diagnosis not present

## 2023-10-20 DIAGNOSIS — Z96652 Presence of left artificial knee joint: Secondary | ICD-10-CM | POA: Diagnosis not present

## 2023-10-20 DIAGNOSIS — Z471 Aftercare following joint replacement surgery: Secondary | ICD-10-CM | POA: Diagnosis not present

## 2023-10-20 DIAGNOSIS — I890 Lymphedema, not elsewhere classified: Secondary | ICD-10-CM | POA: Diagnosis not present

## 2023-10-20 NOTE — Telephone Encounter (Signed)
 Ladona Ridgel (PT)from [facility] called stating pt has a low grade fever for today. Took pt temp today and is normal. Wanted to make Dr Magnus Ivan aware. Ladona Ridgel secure number is [phone]. Pt number is [phone].

## 2023-10-21 ENCOUNTER — Other Ambulatory Visit: Payer: Self-pay | Admitting: Orthopaedic Surgery

## 2023-10-21 MED ORDER — TIZANIDINE HCL 4 MG PO TABS: 4.0000 mg | ORAL_TABLET | Freq: Three times a day (TID) | ORAL | 0 refills | Status: DC | PRN

## 2023-10-21 MED ORDER — OXYCODONE HCL 5 MG PO TABS
5.0000 mg | ORAL_TABLET | Freq: Four times a day (QID) | ORAL | 0 refills | Status: DC | PRN
Start: 2023-10-21 — End: 2023-11-03

## 2023-10-21 NOTE — Telephone Encounter (Signed)
 Patient aware and also called to schedule OPPT. Scheduled to begin 10/30/23 at 11:00 am in [place] OPPT location.

## 2023-10-21 NOTE — Telephone Encounter (Signed)
 I spoke to patient about his low grade fever. He's running 99.7 at the highest. He states he is constipated and we talked about this and what to do. I explained to drink more fluids as well. He needs a refill of pain medication as well as muscle relaxer. Thank you.

## 2023-10-22 DIAGNOSIS — Z7982 Long term (current) use of aspirin: Secondary | ICD-10-CM | POA: Diagnosis not present

## 2023-10-22 DIAGNOSIS — Z7985 Long-term (current) use of injectable non-insulin antidiabetic drugs: Secondary | ICD-10-CM | POA: Diagnosis not present

## 2023-10-22 DIAGNOSIS — I890 Lymphedema, not elsewhere classified: Secondary | ICD-10-CM | POA: Diagnosis not present

## 2023-10-22 DIAGNOSIS — Z85830 Personal history of malignant neoplasm of bone: Secondary | ICD-10-CM | POA: Diagnosis not present

## 2023-10-22 DIAGNOSIS — Z791 Long term (current) use of non-steroidal anti-inflammatories (NSAID): Secondary | ICD-10-CM | POA: Diagnosis not present

## 2023-10-22 DIAGNOSIS — E119 Type 2 diabetes mellitus without complications: Secondary | ICD-10-CM | POA: Diagnosis not present

## 2023-10-22 DIAGNOSIS — Z8579 Personal history of other malignant neoplasms of lymphoid, hematopoietic and related tissues: Secondary | ICD-10-CM | POA: Diagnosis not present

## 2023-10-22 DIAGNOSIS — Z7984 Long term (current) use of oral hypoglycemic drugs: Secondary | ICD-10-CM | POA: Diagnosis not present

## 2023-10-22 DIAGNOSIS — Z96652 Presence of left artificial knee joint: Secondary | ICD-10-CM | POA: Diagnosis not present

## 2023-10-22 DIAGNOSIS — Z471 Aftercare following joint replacement surgery: Secondary | ICD-10-CM | POA: Diagnosis not present

## 2023-10-24 DIAGNOSIS — Z8579 Personal history of other malignant neoplasms of lymphoid, hematopoietic and related tissues: Secondary | ICD-10-CM | POA: Diagnosis not present

## 2023-10-24 DIAGNOSIS — Z7985 Long-term (current) use of injectable non-insulin antidiabetic drugs: Secondary | ICD-10-CM | POA: Diagnosis not present

## 2023-10-24 DIAGNOSIS — Z7982 Long term (current) use of aspirin: Secondary | ICD-10-CM | POA: Diagnosis not present

## 2023-10-24 DIAGNOSIS — Z471 Aftercare following joint replacement surgery: Secondary | ICD-10-CM | POA: Diagnosis not present

## 2023-10-24 DIAGNOSIS — Z85830 Personal history of malignant neoplasm of bone: Secondary | ICD-10-CM | POA: Diagnosis not present

## 2023-10-24 DIAGNOSIS — Z7984 Long term (current) use of oral hypoglycemic drugs: Secondary | ICD-10-CM | POA: Diagnosis not present

## 2023-10-24 DIAGNOSIS — E119 Type 2 diabetes mellitus without complications: Secondary | ICD-10-CM | POA: Diagnosis not present

## 2023-10-24 DIAGNOSIS — I890 Lymphedema, not elsewhere classified: Secondary | ICD-10-CM | POA: Diagnosis not present

## 2023-10-24 DIAGNOSIS — Z96652 Presence of left artificial knee joint: Secondary | ICD-10-CM | POA: Diagnosis not present

## 2023-10-24 DIAGNOSIS — Z791 Long term (current) use of non-steroidal anti-inflammatories (NSAID): Secondary | ICD-10-CM | POA: Diagnosis not present

## 2023-10-27 ENCOUNTER — Encounter: Payer: Medicare Other | Admitting: Orthopaedic Surgery

## 2023-10-27 DIAGNOSIS — Z96652 Presence of left artificial knee joint: Secondary | ICD-10-CM | POA: Diagnosis not present

## 2023-10-27 DIAGNOSIS — Z85830 Personal history of malignant neoplasm of bone: Secondary | ICD-10-CM | POA: Diagnosis not present

## 2023-10-27 DIAGNOSIS — Z471 Aftercare following joint replacement surgery: Secondary | ICD-10-CM | POA: Diagnosis not present

## 2023-10-27 DIAGNOSIS — Z8579 Personal history of other malignant neoplasms of lymphoid, hematopoietic and related tissues: Secondary | ICD-10-CM | POA: Diagnosis not present

## 2023-10-27 DIAGNOSIS — I890 Lymphedema, not elsewhere classified: Secondary | ICD-10-CM | POA: Diagnosis not present

## 2023-10-27 DIAGNOSIS — Z7985 Long-term (current) use of injectable non-insulin antidiabetic drugs: Secondary | ICD-10-CM | POA: Diagnosis not present

## 2023-10-27 DIAGNOSIS — Z7984 Long term (current) use of oral hypoglycemic drugs: Secondary | ICD-10-CM | POA: Diagnosis not present

## 2023-10-27 DIAGNOSIS — E119 Type 2 diabetes mellitus without complications: Secondary | ICD-10-CM | POA: Diagnosis not present

## 2023-10-27 DIAGNOSIS — Z791 Long term (current) use of non-steroidal anti-inflammatories (NSAID): Secondary | ICD-10-CM | POA: Diagnosis not present

## 2023-10-27 DIAGNOSIS — Z7982 Long term (current) use of aspirin: Secondary | ICD-10-CM | POA: Diagnosis not present

## 2023-10-28 DIAGNOSIS — E7849 Other hyperlipidemia: Secondary | ICD-10-CM | POA: Diagnosis not present

## 2023-10-28 DIAGNOSIS — E1140 Type 2 diabetes mellitus with diabetic neuropathy, unspecified: Secondary | ICD-10-CM | POA: Diagnosis not present

## 2023-10-28 DIAGNOSIS — E1165 Type 2 diabetes mellitus with hyperglycemia: Secondary | ICD-10-CM | POA: Diagnosis not present

## 2023-10-28 DIAGNOSIS — E875 Hyperkalemia: Secondary | ICD-10-CM | POA: Diagnosis not present

## 2023-10-29 ENCOUNTER — Encounter: Payer: Self-pay | Admitting: Orthopaedic Surgery

## 2023-10-29 ENCOUNTER — Ambulatory Visit (INDEPENDENT_AMBULATORY_CARE_PROVIDER_SITE_OTHER): Admitting: Orthopaedic Surgery

## 2023-10-29 DIAGNOSIS — I890 Lymphedema, not elsewhere classified: Secondary | ICD-10-CM | POA: Diagnosis not present

## 2023-10-29 DIAGNOSIS — Z85830 Personal history of malignant neoplasm of bone: Secondary | ICD-10-CM | POA: Diagnosis not present

## 2023-10-29 DIAGNOSIS — Z7985 Long-term (current) use of injectable non-insulin antidiabetic drugs: Secondary | ICD-10-CM | POA: Diagnosis not present

## 2023-10-29 DIAGNOSIS — Z96652 Presence of left artificial knee joint: Secondary | ICD-10-CM | POA: Diagnosis not present

## 2023-10-29 DIAGNOSIS — Z7984 Long term (current) use of oral hypoglycemic drugs: Secondary | ICD-10-CM | POA: Diagnosis not present

## 2023-10-29 DIAGNOSIS — E119 Type 2 diabetes mellitus without complications: Secondary | ICD-10-CM | POA: Diagnosis not present

## 2023-10-29 DIAGNOSIS — Z7982 Long term (current) use of aspirin: Secondary | ICD-10-CM | POA: Diagnosis not present

## 2023-10-29 DIAGNOSIS — Z8579 Personal history of other malignant neoplasms of lymphoid, hematopoietic and related tissues: Secondary | ICD-10-CM | POA: Diagnosis not present

## 2023-10-29 DIAGNOSIS — Z791 Long term (current) use of non-steroidal anti-inflammatories (NSAID): Secondary | ICD-10-CM | POA: Diagnosis not present

## 2023-10-29 DIAGNOSIS — Z471 Aftercare following joint replacement surgery: Secondary | ICD-10-CM | POA: Diagnosis not present

## 2023-10-29 NOTE — Progress Notes (Signed)
 The patient is here for his first postoperative visit status post a left total knee replacement to treat severe left knee arthritis.  He is doing well overall.  I did remove the staples in place Steri-Strips over his left knee incision.  There is a small area medially that is puckered from a deep Vicryl sutures that will dissolve with time.  He lacks full extension by about 5 degrees and I can flex him to 90 degrees in the office today.  His calf is soft and he has good motor and sensory function of his left lower extremity.  He will transition outpatient physical therapy and we will see him back in a month to assess the range of motion of his left knee.  All question concerns were answered and addressed.  He can stop his baby aspirin twice daily.  His calf is soft.  He will still wear his TED hose if he is having swelling in his foot and ankle.

## 2023-10-30 ENCOUNTER — Ambulatory Visit (HOSPITAL_COMMUNITY)

## 2023-10-30 ENCOUNTER — Ambulatory Visit (HOSPITAL_COMMUNITY): Attending: Orthopaedic Surgery

## 2023-10-30 ENCOUNTER — Other Ambulatory Visit: Payer: Self-pay

## 2023-10-30 ENCOUNTER — Encounter (HOSPITAL_COMMUNITY): Payer: Self-pay

## 2023-10-30 DIAGNOSIS — M25562 Pain in left knee: Secondary | ICD-10-CM | POA: Diagnosis not present

## 2023-10-30 DIAGNOSIS — R262 Difficulty in walking, not elsewhere classified: Secondary | ICD-10-CM | POA: Diagnosis not present

## 2023-10-30 DIAGNOSIS — M1712 Unilateral primary osteoarthritis, left knee: Secondary | ICD-10-CM | POA: Insufficient documentation

## 2023-10-30 NOTE — Therapy (Signed)
 OUTPATIENT PHYSICAL THERAPY LOWER EXTREMITY EVALUATION   Patient Name: Luis Fitzgerald MRN: [id number] DOB:[birth year], 68 y.o., male Today's Date: 10/30/2023  END OF SESSION:  PT End of Session - 10/30/23 0937     Visit Number 1    Authorization Type [employer] Medicare    Authorization Time Period seeking auth    PT Start Time [phone]    PT Stop Time 1015    PT Time Calculation (min) 41 min    Activity Tolerance Patient tolerated treatment well    Behavior During Therapy WFL for tasks assessed/performed             Past Medical History:  Diagnosis Date   Cancer (HCC)    melanoma   Diabetes mellitus without complication (HCC)    Lymphedema    left arm   Melanoma (HCC)    left arm   Past Surgical History:  Procedure Laterality Date   EYE SURGERY Left    knee arthroscope Left    TOTAL KNEE ARTHROPLASTY Left 10/14/2023   Procedure: LEFT TOTAL KNEE ARTHROPLASTY;  Surgeon: Kathryne Hitch, MD;  Location: [facility] OR;  Service: Orthopedics;  Laterality: Left;   Patient Active Problem List   Diagnosis Date Noted   Status post total left knee replacement 10/14/2023   Melanoma (HCC) 02/08/2016   Regional lymph node metastasis present (HCC) 02/08/2016   Colitis 09/13/2015   H/O hepatitis 07/18/2015   Melanoma of forearm, left (HCC) 06/27/2015    PCP: Estanislado Pandy, MD  REFERRING PROVIDER: Kathryne Hitch, MD  REFERRING DIAG: [id number] (ICD-10-CM) - Unilateral primary osteoarthritis, left knee   THERAPY DIAG:  Acute pain of left knee  Difficulty in walking, not elsewhere classified  Rationale for Evaluation and Treatment: Rehabilitation  ONSET DATE: 10/14/2023 - L TKA  SUBJECTIVE:   SUBJECTIVE STATEMENT: Pt reports he has had multiple cortisone shots then got the TKA on 10/14/23 because he wants to be able to get back to doing things he was doing. Getting about 5 hours a night of sleep but has to take medication for it. Three hardest things to do: putting  compression sock on, walking without a device, and getting in and out of the car because has to watch what he does. Had [facility] therapy and last time they checked it was 115 flexion.   PERTINENT HISTORY: Cancer, melanoma, colitis, lymphedema, DM PAIN:  Are you having pain? Yes: NPRS scale: current 2 - worst (late in day when going to bed) 5 - best 0 (with medication and rest) Pain location: in the knee Pain description: more of a pinching type pain Aggravating factors: standing in one position, walking >300' prior to sitting Relieving factors: icing, resting, and moving it around  PRECAUTIONS: Knee - hx L TKA; fall risk  RED FLAGS: CANCER    WEIGHT BEARING RESTRICTIONS: No  FALLS:  Has patient fallen in last 6 months? No  LIVING ENVIRONMENT: Lives with: lives with their spouse Lives in: House/apartment Stairs: Yes: External: 4 steps; on left going up Has following equipment at home: Dan Humphreys - 2 wheeled and shower chair  OCCUPATION: Prior worked on his knees with [employer]   PLOF: Independent with basic ADLs, Independent with gait, Independent with transfers, and Leisure: bowling, golf, lawn mowing  PATIENT GOALS: "Gotta be able to go bowling, play golf, and get on and off lawn mower to mow grass."  NEXT MD VISIT: TBD  OBJECTIVE:  Note: Objective measures were completed at Evaluation unless otherwise noted.  PATIENT SURVEYS:  LEFS 19/80 - 23.8%  COGNITION: Overall cognitive status: Within functional limits for tasks assessed     SENSATION: WFL  EDEMA:  Swelling in knee noted WNL  MUSCLE LENGTH: Hamstrings: Right slight limited deg; Left limited deg  PALPATION: ITB tightness noted; decreased quad activation on palpation (poor VMO activation noted)   LOWER EXTREMITY ROM:  Active / Passive ROM Right eval Left eval  Hip flexion    Hip extension    Hip abduction    Hip adduction    Hip internal rotation    Hip external rotation    Knee flexion 116 A 86 P 93   Knee extension  Lacking 5 A lacking 15 P lacking 10  Ankle dorsiflexion    Ankle plantarflexion    Ankle inversion    Ankle eversion     (Blank rows = not tested)  LOWER EXTREMITY MMT:  MMT Right eval Left eval  Hip flexion 4/5 3+/5  Hip extension 3+/5 3/5  Hip abduction    Hip adduction    Hip internal rotation    Hip external rotation    Knee flexion 4-/5   Knee extension 4-/5   Ankle dorsiflexion 4/5 4-/5  Ankle plantarflexion 4-/5 4-/5  Ankle inversion    Ankle eversion     (Blank rows = not tested)  FUNCTIONAL TESTS:  5 times sit to stand: 37s requiring BUE assist and demonstrates L LE anterior and emphasis on use of R LE  30 seconds chair stand test - 4 repetitions  GAIT: Distance walked: 224' Assistive device utilized: Environmental consultant - 2 wheeled Level of assistance: Modified independence (cues for heel strike and use of RW) Comments: poor functional flexion and lateral flexion for progression                                                                                                                              TREATMENT DATE:   10/30/23 - HEP prescription and demonstration - education on functional engagement of quad and heel slides as well   PATIENT EDUCATION:  Education details: HEP and continuing to perform functional flexion in order to improve gait safety and mechanics Person educated: Patient Education method: Medical illustrator Education comprehension: verbalized understanding and returned demonstration  HOME EXERCISE PROGRAM: Current HH exercises: SLR, heel slides, heel raises/toe raises, quad sets, hip abduction  Access Code: [email or web address] URL: [email or web address] Date: 10/30/2023 Prepared by: Bettey Mare  Exercises - Supine Bridge  - 1 x daily - 7 x weekly - 3 sets - 10 reps - Standing Knee Flexion Stretch on Step  - 1 x daily - 7 x weekly - 2 sets - 10 reps - 10s hold - Seated Hamstring Stretch  - 1 x daily - 7 x  weekly - 1 sets - 8 reps - 10s hold  ASSESSMENT:  CLINICAL IMPRESSION: Patient is a 68 y.o. male who was seen today for physical therapy evaluation and treatment  for s/p L TKA on 10/14/23 with deficits including A/PROM deficits, strength, safety with gait, and poor balance/coordination needed for improving functional safety and transfers as was in PLOF. Pt independent driving and performing household chores/yard work in [employer] and recommend continued skilled PT services.    OBJECTIVE IMPAIRMENTS: Abnormal gait, decreased activity tolerance, decreased balance, decreased cognition, decreased coordination, decreased endurance, decreased mobility, difficulty walking, decreased ROM, decreased strength, increased fascial restrictions, impaired flexibility, and pain.   ACTIVITY LIMITATIONS: carrying, lifting, bending, standing, squatting, stairs, and transfers  PARTICIPATION LIMITATIONS: meal prep, driving, community activity, and yard work  PERSONAL FACTORS: 1-2 comorbidities: hx cancer, lymphedema, and DM  are also affecting patient's functional outcome.   REHAB POTENTIAL: Good  CLINICAL DECISION MAKING: Evolving/moderate complexity  EVALUATION COMPLEXITY: Moderate   GOALS: Goals reviewed with patient? Yes  SHORT TERM GOALS: Target date: 11/29/23 Pt will be independent w/ HEP in order to demonstrate improved functional compliance toward ADL independence.  Baseline: some compliance w/ HH PT HEP Goal status: INITIAL  2.  Pt will achieve L knee AROM (-5) to 100 for demonstrating progressions needed for stair navigation.  Baseline: (-15) - 86 degrees Goal status: INITIAL  LONG TERM GOALS: Target date: 01/22/24  Pt will be able to ambulate >350' without use of AD and typical gait pattern in 2 minutes to indicate improved gait safety and independence.  Baseline: 224' w/ RW in 2 minutes Goal status: INITIAL  2.  Pt will be able to navigate stairs with independence in typical pattern.   Baseline: step to with use of rail and poor mechanics Goal status: INITIAL  3.  Pt L knee ROM active from 0 - 120 degrees in order to indicate ROM needed for functional gait and stair navigation with safety.  Baseline: (-15) - 86 Goal status: INITIAL  4.  Pt will perform FTSTS in <15s with no use of UE to indicate improved functional BLE strength and endurance. Baseline: 37s w/ BUE needed Goal status: INITIAL  PLAN:  PT FREQUENCY: 2x/week  PT DURATION: 12 weeks  PLANNED INTERVENTIONS: 97110-Therapeutic exercises, 97530- Therapeutic activity, 97112- Neuromuscular re-education, 97535- Self Care, 25366- Manual therapy, [phone]- Gait training, Patient/Family education, Balance training, and Stair training  PLAN FOR NEXT SESSION: continued progression in ROM with recumbent bike, PT assisted heel slides, functional contract - relax as tolerated  Luis Fitzgerald PT, DPT [facility] [place] [address] [phone] office  Luis Fitzgerald, PT 10/30/2023, 12:57 PM   [facility] Medicare Auth Request Information  Date of referral: 10/14/23 Referring provider: Doneen Poisson MD Referring diagnosis (ICD 10)? M17.12 (ICD-10-CM) - Unilateral primary osteoarthritis, left knee  Treatment diagnosis (ICD 10)? (if different than referring diagnosis) M25.562 - acute pain of left knee  Functional Tool Score: LEFS - 19/80 - 23.8%  What was this (referring dx) caused by? Surgery (Type: L total knee arthroplasty)  Ashby Dawes of Condition: Initial Onset (within last 3 months)   Laterality: Lt  Current Functional Measure Score: LEFS 19/80 - 23.8%  Objective measurements identify impairments when they are compared to normal values, the uninvolved extremity, and prior level of function.  [x]  Yes  []  No  Objective assessment of functional ability: Moderate functional limitations   Briefly describe symptoms: Strength, range of motion and gait limitations in L LE secondary to acuity  of surgery and rehab needed for progression to independence  How did symptoms start: 10/14/23 (date of surgery)   Average pain intensity:  Last 24 hours: 5/10  Past  week: 5/10  How often does the pt experience symptoms? Frequently  How much have the symptoms interfered with usual daily activities? Quite a bit  How has condition changed since care began at this facility? NA - initial visit  In general, how is the patients overall health? Very Good   BACK PAIN (STarT Back Screening Tool) No

## 2023-11-03 ENCOUNTER — Other Ambulatory Visit: Payer: Self-pay | Admitting: Orthopaedic Surgery

## 2023-11-03 ENCOUNTER — Ambulatory Visit (HOSPITAL_COMMUNITY): Admitting: Physical Therapy

## 2023-11-03 ENCOUNTER — Telehealth: Payer: Self-pay | Admitting: *Deleted

## 2023-11-03 DIAGNOSIS — M25562 Pain in left knee: Secondary | ICD-10-CM | POA: Diagnosis not present

## 2023-11-03 DIAGNOSIS — R262 Difficulty in walking, not elsewhere classified: Secondary | ICD-10-CM

## 2023-11-03 DIAGNOSIS — M1712 Unilateral primary osteoarthritis, left knee: Secondary | ICD-10-CM | POA: Diagnosis not present

## 2023-11-03 MED ORDER — TIZANIDINE HCL 4 MG PO TABS: 4.0000 mg | ORAL_TABLET | Freq: Three times a day (TID) | ORAL | 0 refills | Status: DC | PRN

## 2023-11-03 MED ORDER — OXYCODONE HCL 5 MG PO TABS
5.0000 mg | ORAL_TABLET | Freq: Four times a day (QID) | ORAL | 0 refills | Status: DC | PRN
Start: 2023-11-03 — End: 2023-11-21

## 2023-11-03 NOTE — Therapy (Signed)
 OUTPATIENT PHYSICAL THERAPY TREATMENT   Patient Name: Luis Fitzgerald MRN: [id number] DOB:13-May-1956, 68 y.o., male Today's Date: 11/03/2023  END OF SESSION:  PT End of Session - 11/03/23 0904     Visit Number 2    Authorization Type [employer] Medicare    Authorization Time Period seeking auth    Progress Note Due on Visit 10    PT Start Time 0845    PT Stop Time 0928    PT Time Calculation (min) 43 min    Activity Tolerance Patient tolerated treatment well    Behavior During Therapy WFL for tasks assessed/performed             Past Medical History:  Diagnosis Date   Cancer (HCC)    melanoma   Diabetes mellitus without complication (HCC)    Lymphedema    left arm   Melanoma (HCC)    left arm   Past Surgical History:  Procedure Laterality Date   EYE SURGERY Left    knee arthroscope Left    TOTAL KNEE ARTHROPLASTY Left 10/14/2023   Procedure: LEFT TOTAL KNEE ARTHROPLASTY;  Surgeon: Kathryne Hitch, MD;  Location: [facility] OR;  Service: Orthopedics;  Laterality: Left;   Patient Active Problem List   Diagnosis Date Noted   Status post total left knee replacement 10/14/2023   Melanoma (HCC) 02/08/2016   Regional lymph node metastasis present (HCC) 02/08/2016   Colitis 09/13/2015   H/O hepatitis 07/18/2015   Melanoma of forearm, left (HCC) 06/27/2015    PCP: Estanislado Pandy, MD  REFERRING PROVIDER: Kathryne Hitch, MD  REFERRING DIAG: [phone] (ICD-10-CM) - Unilateral primary osteoarthritis, left knee   THERAPY DIAG:  No diagnosis found.  Rationale for Evaluation and Treatment: Rehabilitation  ONSET DATE: 10/14/2023 - L TKA  SUBJECTIVE:   SUBJECTIVE STATEMENT: 11/03/23:  pt states he's doing good except the stiffness; most worrisome at night.  Continues to ambulate with RW.  Evaluation: Pt reports he has had multiple cortisone shots then got the TKA on 10/14/23 because he wants to be able to get back to doing things he was doing. Getting about 5 hours a  night of sleep but has to take medication for it. Three hardest things to do: putting compression sock on, walking without a device, and getting in and out of the car because has to watch what he does. Had [facility] therapy and last time they checked it was 115 flexion.   PERTINENT HISTORY: Cancer, melanoma, colitis, lymphedema, DM, Lt UE lymphedema PAIN:  Are you having pain? Yes: NPRS scale: current 2 - worst (late in day when going to bed) 5 - best 0 (with medication and rest) Pain location: in the knee Pain description: more of a pinching type pain Aggravating factors: standing in one position, walking >300' prior to sitting Relieving factors: icing, resting, and moving it around  PRECAUTIONS: Knee - hx L TKA; fall risk. Lt UE lymphedema  RED FLAGS: CANCER    WEIGHT BEARING RESTRICTIONS: No  FALLS:  Has patient fallen in last 6 months? No  LIVING ENVIRONMENT: Lives with: lives with their spouse Lives in: House/apartment Stairs: Yes: External: 4 steps; on left going up Has following equipment at home: Dan Humphreys - 2 wheeled and shower chair  OCCUPATION: Prior worked on his knees with [employer]   PLOF: Independent with basic ADLs, Independent with gait, Independent with transfers, and Leisure: bowling, golf, lawn mowing  PATIENT GOALS: "Gotta be able to go bowling, play golf, and get on  and off lawn mower to mow grass."  NEXT MD VISIT: TBD  OBJECTIVE:  Note: Objective measures were completed at Evaluation unless otherwise noted.  PATIENT SURVEYS:  LEFS 19/80 - 23.8%  COGNITION: Overall cognitive status: Within functional limits for tasks assessed     SENSATION: WFL  EDEMA:  Swelling in knee noted WNL  MUSCLE LENGTH: Hamstrings: Right slight limited deg; Left limited deg  PALPATION: ITB tightness noted; decreased quad activation on palpation (poor VMO activation noted)   LOWER EXTREMITY ROM:  Active / Passive ROM Right eval Left eval  Hip flexion    Hip  extension    Hip abduction    Hip adduction    Hip internal rotation    Hip external rotation    Knee flexion 116 A 86 P 93  Knee extension  Lacking 5 A lacking 15 P lacking 10  Ankle dorsiflexion    Ankle plantarflexion    Ankle inversion    Ankle eversion     (Blank rows = not tested)  LOWER EXTREMITY MMT:  MMT Right eval Left eval  Hip flexion 4/5 3+/5  Hip extension 3+/5 3/5  Hip abduction    Hip adduction    Hip internal rotation    Hip external rotation    Knee flexion 4-/5   Knee extension 4-/5   Ankle dorsiflexion 4/5 4-/5  Ankle plantarflexion 4-/5 4-/5  Ankle inversion    Ankle eversion     (Blank rows = not tested)  FUNCTIONAL TESTS:  5 times sit to stand: 37s requiring BUE assist and demonstrates L LE anterior and emphasis on use of R LE  30 seconds chair stand test - 4 repetitions  GAIT: Distance walked: 224' Assistive device utilized: Environmental consultant - 2 wheeled Level of assistance: Modified independence (cues for heel strike and use of RW) Comments: poor functional flexion and lateral flexion for progression                                                                                                                              TREATMENT DATE:   11/03/23 Goal review Bike seat 14 rocking 5 minutes Standing;  knee flexion stretch with 12" step 10X10"  Knee flexion 10X  Hamstring stretch with 12" step 4X20" Ambulation with SPC SBA Supine:  heel slides 10X5", AROM 90 degrees  Quad sets 10X5" AROM -10 degrees  10/30/23 - HEP prescription and demonstration - education on functional engagement of quad and heel slides as well   PATIENT EDUCATION:  Education details: HEP and continuing to perform functional flexion in order to improve gait safety and mechanics Person educated: Patient Education method: Medical illustrator Education comprehension: verbalized understanding and returned demonstration  HOME EXERCISE PROGRAM: Current HH exercises:  SLR, heel slides, heel raises/toe raises, quad sets, hip abduction  Access Code: [email or web address] URL: [email or web address] Date: 10/30/2023 Prepared by: Bettey Mare  Exercises - Supine Bridge  - 1 x daily - 7 x  weekly - 3 sets - 10 reps - Standing Knee Flexion Stretch on Step  - 1 x daily - 7 x weekly - 2 sets - 10 reps - 10s hold - Seated Hamstring Stretch  - 1 x daily - 7 x weekly - 1 sets - 8 reps - 10s hold  ASSESSMENT:  CLINICAL IMPRESSION: Reviewed goals and POC moving forward.  Pt continues to be very limited with ROM, still using RW for ambulation.  Began on bike followed by stretches to work on ROM.  Gait training with SPC today with SBA, good cadence and sequencing.  AROM following supine exercise -10 to 90 degrees.  Noted area on Lt medial arm that pt reports has been there for 6-8 months and comes back.  Recommended getting area taken care of as visibly concerning.  Educated on lymphedema and also encouraged pt to do more research himself as noted severe congestion in Lt UE.  Pt reported someone telling him it would "get better".  Informed pt that we have 3 certified lymphatic therapist at our clinic and could help reduce and get him into a garment.  Pt reported he would discuss with MD.   Evaluation: Patient is a 68 y.o. male who was seen today for physical therapy evaluation and treatment for s/p L TKA on 10/14/23 with deficits including A/PROM deficits, strength, safety with gait, and poor balance/coordination needed for improving functional safety and transfers as was in PLOF. Pt independent driving and performing household chores/yard work in [employer] and recommend continued skilled PT services.    OBJECTIVE IMPAIRMENTS: Abnormal gait, decreased activity tolerance, decreased balance, decreased cognition, decreased coordination, decreased endurance, decreased mobility, difficulty walking, decreased ROM, decreased strength, increased fascial restrictions, impaired flexibility,  and pain.   ACTIVITY LIMITATIONS: carrying, lifting, bending, standing, squatting, stairs, and transfers  PARTICIPATION LIMITATIONS: meal prep, driving, community activity, and yard work  PERSONAL FACTORS: 1-2 comorbidities: hx cancer, lymphedema, and DM  are also affecting patient's functional outcome.   REHAB POTENTIAL: Good  CLINICAL DECISION MAKING: Evolving/moderate complexity  EVALUATION COMPLEXITY: Moderate   GOALS: Goals reviewed with patient? Yes  SHORT TERM GOALS: Target date: 11/29/23 Pt will be independent w/ HEP in order to demonstrate improved functional compliance toward ADL independence.  Baseline: some compliance w/ HH PT HEP Goal status: IN PROGRESS  2.  Pt will achieve L knee AROM (-5) to 100 for demonstrating progressions needed for stair navigation.  Baseline: (-15) - 86 degrees Goal status: IN PROGRESS  LONG TERM GOALS: Target date: 01/22/24  Pt will be able to ambulate >350' without use of AD and typical gait pattern in 2 minutes to indicate improved gait safety and independence.  Baseline: 224' w/ RW in 2 minutes Goal status: IN PROGRESS  2.  Pt will be able to navigate stairs with independence in typical pattern.  Baseline: step to with use of rail and poor mechanics Goal status: IN PROGRESS  3.  Pt L knee ROM active from 0 - 120 degrees in order to indicate ROM needed for functional gait and stair navigation with safety.  Baseline: (-15) - 86 Goal status: IN PROGRESS  4.  Pt will perform FTSTS in <15s with no use of UE to indicate improved functional BLE strength and endurance. Baseline: 37s w/ BUE needed Goal status: IN PROGRESS  PLAN:  PT FREQUENCY: 2x/week  PT DURATION: 12 weeks  PLANNED INTERVENTIONS: 97110-Therapeutic exercises, 97530- Therapeutic activity, [id number]- Neuromuscular re-education, 97535- Self Care, [id number]- Manual therapy, [phone]- Gait training, Patient/Family  education, Location manager, and Stair training  PLAN FOR NEXT  SESSION: check insurance approval; continue with primary focus on AROM, gait with LRAD then return to functional strengthening.   Emeline Gins B, PTA 11/03/2023, 12:16 PM

## 2023-11-03 NOTE — Telephone Encounter (Signed)
 Patient called and would like refill of pain medication as well as muscle relaxer. Doing well with therapy. Thank you.

## 2023-11-05 ENCOUNTER — Encounter (HOSPITAL_COMMUNITY): Payer: Self-pay

## 2023-11-05 ENCOUNTER — Ambulatory Visit (HOSPITAL_COMMUNITY)

## 2023-11-05 DIAGNOSIS — E782 Mixed hyperlipidemia: Secondary | ICD-10-CM | POA: Diagnosis not present

## 2023-11-05 DIAGNOSIS — Z85820 Personal history of malignant melanoma of skin: Secondary | ICD-10-CM | POA: Diagnosis not present

## 2023-11-05 DIAGNOSIS — E1140 Type 2 diabetes mellitus with diabetic neuropathy, unspecified: Secondary | ICD-10-CM | POA: Diagnosis not present

## 2023-11-05 DIAGNOSIS — M25562 Pain in left knee: Secondary | ICD-10-CM

## 2023-11-05 DIAGNOSIS — R262 Difficulty in walking, not elsewhere classified: Secondary | ICD-10-CM

## 2023-11-05 DIAGNOSIS — M1712 Unilateral primary osteoarthritis, left knee: Secondary | ICD-10-CM | POA: Diagnosis not present

## 2023-11-05 DIAGNOSIS — E7849 Other hyperlipidemia: Secondary | ICD-10-CM | POA: Diagnosis not present

## 2023-11-05 DIAGNOSIS — Z23 Encounter for immunization: Secondary | ICD-10-CM | POA: Diagnosis not present

## 2023-11-05 NOTE — Therapy (Signed)
 OUTPATIENT PHYSICAL THERAPY TREATMENT   Patient Name: Luis Fitzgerald MRN: [id number] DOB:1956/02/02, 68 y.o., male Today's Date: 11/05/2023  END OF SESSION:  PT End of Session - 11/05/23 1017     Visit Number 3    Number of Visits 12    Date for PT Re-Evaluation 01/22/24    Authorization Type [employer] Medicare    Authorization Time Period seeking auth    Progress Note Due on Visit 10    PT Start Time 0935    PT Stop Time 1014    PT Time Calculation (min) 39 min    Equipment Utilized During Treatment --   SBA during gait training with SPC   Activity Tolerance Patient tolerated treatment well    Behavior During Therapy WFL for tasks assessed/performed              Past Medical History:  Diagnosis Date   Cancer (HCC)    melanoma   Diabetes mellitus without complication (HCC)    Lymphedema    left arm   Melanoma (HCC)    left arm   Past Surgical History:  Procedure Laterality Date   EYE SURGERY Left    knee arthroscope Left    TOTAL KNEE ARTHROPLASTY Left 10/14/2023   Procedure: LEFT TOTAL KNEE ARTHROPLASTY;  Surgeon: Kathryne Hitch, MD;  Location: [facility] OR;  Service: Orthopedics;  Laterality: Left;   Patient Active Problem List   Diagnosis Date Noted   Status post total left knee replacement 10/14/2023   Melanoma (HCC) 02/08/2016   Regional lymph node metastasis present (HCC) 02/08/2016   Colitis 09/13/2015   H/O hepatitis 07/18/2015   Melanoma of forearm, left (HCC) 06/27/2015    PCP: Estanislado Pandy, MD  REFERRING PROVIDER: Kathryne Hitch, MD  REFERRING DIAG: [phone] (ICD-10-CM) - Unilateral primary osteoarthritis, left knee   THERAPY DIAG:  Acute pain of left knee  Difficulty in walking, not elsewhere classified  Rationale for Evaluation and Treatment: Rehabilitation  ONSET DATE: 10/14/2023 - L TKA  SUBJECTIVE:   SUBJECTIVE STATEMENT: 11/05/23:  Pt stated more stiffness than pain today.  Has been walking some around the house without  RW, has been wall walking.  Evaluation: Pt reports he has had multiple cortisone shots then got the TKA on 10/14/23 because he wants to be able to get back to doing things he was doing. Getting about 5 hours a night of sleep but has to take medication for it. Three hardest things to do: putting compression sock on, walking without a device, and getting in and out of the car because has to watch what he does. Had [facility] therapy and last time they checked it was 115 flexion.   PERTINENT HISTORY: Cancer, melanoma, colitis, lymphedema, DM, Lt UE lymphedema PAIN:  Are you having pain? Yes: NPRS scale: current 2 - worst (late in day when going to bed) 5 - best 0 (with medication and rest) Pain location: in the knee Pain description: more of a pinching type pain Aggravating factors: standing in one position, walking >300' prior to sitting Relieving factors: icing, resting, and moving it around  PRECAUTIONS: Knee - hx L TKA; fall risk. Lt UE lymphedema  RED FLAGS: CANCER    WEIGHT BEARING RESTRICTIONS: No  FALLS:  Has patient fallen in last 6 months? No  LIVING ENVIRONMENT: Lives with: lives with their spouse Lives in: House/apartment Stairs: Yes: External: 4 steps; on left going up Has following equipment at home: Dan Humphreys - 2 wheeled and shower  chair  OCCUPATION: Prior worked on his knees with [employer]   PLOF: Independent with basic ADLs, Independent with gait, Independent with transfers, and Leisure: bowling, golf, lawn mowing  PATIENT GOALS: "Gotta be able to go bowling, play golf, and get on and off lawn mower to mow grass."  NEXT MD VISIT: TBD  OBJECTIVE:  Note: Objective measures were completed at Evaluation unless otherwise noted.  PATIENT SURVEYS:  LEFS 19/80 - 23.8%  COGNITION: Overall cognitive status: Within functional limits for tasks assessed     SENSATION: WFL  EDEMA:  Swelling in knee noted WNL  MUSCLE LENGTH: Hamstrings: Right slight limited deg; Left  limited deg  PALPATION: ITB tightness noted; decreased quad activation on palpation (poor VMO activation noted)   LOWER EXTREMITY ROM:  Active / Passive ROM Right eval Left eval  Hip flexion    Hip extension    Hip abduction    Hip adduction    Hip internal rotation    Hip external rotation    Knee flexion 116 A 86 P 93  Knee extension  Lacking 5 A lacking 15 P lacking 10  Ankle dorsiflexion    Ankle plantarflexion    Ankle inversion    Ankle eversion     (Blank rows = not tested)  LOWER EXTREMITY MMT:  MMT Right eval Left eval  Hip flexion 4/5 3+/5  Hip extension 3+/5 3/5  Hip abduction    Hip adduction    Hip internal rotation    Hip external rotation    Knee flexion 4-/5   Knee extension 4-/5   Ankle dorsiflexion 4/5 4-/5  Ankle plantarflexion 4-/5 4-/5  Ankle inversion    Ankle eversion     (Blank rows = not tested)  FUNCTIONAL TESTS:  5 times sit to stand: 37s requiring BUE assist and demonstrates L LE anterior and emphasis on use of R LE  30 seconds chair stand test - 4 repetitions  GAIT: Distance walked: 224' Assistive device utilized: Environmental consultant - 2 wheeled Level of assistance: Modified independence (cues for heel strike and use of RW) Comments: poor functional flexion and lateral flexion for progression                                                                                                                              TREATMENT DATE:   11/05/23: Bike seat 15 rocking 5 minutes Standing:    Knee drive on 09WJ step 5x 10"  Heel raise on incline slope 10x 3"  Toe raises on decline slope  TKE RTB 10x 5" Sit to stand with foam elevated height no HHA   Gait training with SPC SBA x 137ft, good 2 point sequence  Supine: SAQ 10x Heel slide AROM 8-91 degrees Hamstring stretch with rope 3x 30"   11/03/23 Goal review Bike seat 14 rocking 5 minutes Standing;  knee flexion stretch with 12" step 10X10"  Knee flexion 10X  Hamstring stretch with 12"  step 4X20" Ambulation with SPC SBA Supine:  heel slides 10X5", AROM 90 degrees  Quad sets 10X5" AROM -10 degrees  10/30/23 - HEP prescription and demonstration - education on functional engagement of quad and heel slides as well   PATIENT EDUCATION:  Education details: HEP and continuing to perform functional flexion in order to improve gait safety and mechanics Person educated: Patient Education method: Medical illustrator Education comprehension: verbalized understanding and returned demonstration  HOME EXERCISE PROGRAM: Current HH exercises: SLR, heel slides, heel raises/toe raises, quad sets, hip abduction  Access Code: [email or web address] URL: [email or web address] Date: 10/30/2023 Prepared by: Bettey Mare  Exercises - Supine Bridge  - 1 x daily - 7 x weekly - 3 sets - 10 reps - Standing Knee Flexion Stretch on Step  - 1 x daily - 7 x weekly - 2 sets - 10 reps - 10s hold - Seated Hamstring Stretch  - 1 x daily - 7 x weekly - 1 sets - 8 reps - 10s hold  ASSESSMENT:  CLINICAL IMPRESSION: Session focus with knee moblity and proximal strengthening.  Added TKE and SAQ to address extension knee and AAROM with rope to assist with end range flexion.  AROM 8-91 degrees.  Added SAQ and propping up to HEP to assist with extension lag.  Ambulated with SPC through session with good sequence and stable, min cueing to improve stride length and heel to toe mechanics.  No reports of increased pain through session.  Evaluation: Patient is a 68 y.o. male who was seen today for physical therapy evaluation and treatment for s/p L TKA on 10/14/23 with deficits including A/PROM deficits, strength, safety with gait, and poor balance/coordination needed for improving functional safety and transfers as was in PLOF. Pt independent driving and performing household chores/yard work in [employer] and recommend continued skilled PT services.    OBJECTIVE IMPAIRMENTS: Abnormal gait, decreased activity  tolerance, decreased balance, decreased cognition, decreased coordination, decreased endurance, decreased mobility, difficulty walking, decreased ROM, decreased strength, increased fascial restrictions, impaired flexibility, and pain.   ACTIVITY LIMITATIONS: carrying, lifting, bending, standing, squatting, stairs, and transfers  PARTICIPATION LIMITATIONS: meal prep, driving, community activity, and yard work  PERSONAL FACTORS: 1-2 comorbidities: hx cancer, lymphedema, and DM  are also affecting patient's functional outcome.   REHAB POTENTIAL: Good  CLINICAL DECISION MAKING: Evolving/moderate complexity  EVALUATION COMPLEXITY: Moderate   GOALS: Goals reviewed with patient? Yes  SHORT TERM GOALS: Target date: 11/29/23 Pt will be independent w/ HEP in order to demonstrate improved functional compliance toward ADL independence.  Baseline: some compliance w/ HH PT HEP Goal status: IN PROGRESS  2.  Pt will achieve L knee AROM (-5) to 100 for demonstrating progressions needed for stair navigation.  Baseline: (-15) - 86 degrees Goal status: IN PROGRESS  LONG TERM GOALS: Target date: 01/22/24  Pt will be able to ambulate >350' without use of AD and typical gait pattern in 2 minutes to indicate improved gait safety and independence.  Baseline: 224' w/ RW in 2 minutes Goal status: IN PROGRESS  2.  Pt will be able to navigate stairs with independence in typical pattern.  Baseline: step to with use of rail and poor mechanics Goal status: IN PROGRESS  3.  Pt L knee ROM active from 0 - 120 degrees in order to indicate ROM needed for functional gait and stair navigation with safety.  Baseline: (-15) - 86 Goal status: IN PROGRESS  4.  Pt will perform FTSTS in <15s with no use of  UE to indicate improved functional BLE strength and endurance. Baseline: 37s w/ BUE needed Goal status: IN PROGRESS  PLAN:  PT FREQUENCY: 2x/week  PT DURATION: 12 weeks  PLANNED INTERVENTIONS: 97110-Therapeutic  exercises, 97530- Therapeutic activity, 97112- Neuromuscular re-education, 97535- Self Care, 16109- Manual therapy, [phone]- Gait training, Patient/Family education, Balance training, and Stair training  PLAN FOR NEXT SESSION: check insurance approval; continue with primary focus on AROM, gait with LRAD then return to functional strengthening.   Becky Sax, LPTA/CLT; CBIS [phone]  Juel Burrow, PTA 11/05/2023, 11:11 AM

## 2023-11-11 ENCOUNTER — Ambulatory Visit (HOSPITAL_COMMUNITY)

## 2023-11-11 ENCOUNTER — Encounter (HOSPITAL_COMMUNITY): Payer: Self-pay

## 2023-11-11 DIAGNOSIS — R262 Difficulty in walking, not elsewhere classified: Secondary | ICD-10-CM | POA: Diagnosis not present

## 2023-11-11 DIAGNOSIS — M1712 Unilateral primary osteoarthritis, left knee: Secondary | ICD-10-CM | POA: Diagnosis not present

## 2023-11-11 DIAGNOSIS — M25562 Pain in left knee: Secondary | ICD-10-CM | POA: Diagnosis not present

## 2023-11-11 NOTE — Therapy (Signed)
 OUTPATIENT PHYSICAL THERAPY TREATMENT   Patient Name: Luis Fitzgerald MRN: [id number] DOB:11/08/1955, 68 y.o., male Today's Date: 11/11/2023  END OF SESSION:  PT End of Session - 11/11/23 1431     Visit Number 4    Number of Visits 12    Date for PT Re-Evaluation 01/22/24    Authorization Type UHC Medicare    Authorization Time Period uhc approved 6 visits from 10/30/2023-12/25/23    Authorization - Visit Number 3    Authorization - Number of Visits 6    Progress Note Due on Visit 10    PT Start Time 1433    PT Stop Time 1515    PT Time Calculation (min) 42 min    Activity Tolerance Patient tolerated treatment well    Behavior During Therapy WFL for tasks assessed/performed              Past Medical History:  Diagnosis Date   Cancer (HCC)    melanoma   Diabetes mellitus without complication (HCC)    Lymphedema    left arm   Melanoma (HCC)    left arm   Past Surgical History:  Procedure Laterality Date   EYE SURGERY Left    knee arthroscope Left    TOTAL KNEE ARTHROPLASTY Left 10/14/2023   Procedure: LEFT TOTAL KNEE ARTHROPLASTY;  Surgeon: Arnie Lao, MD;  Location: [facility] OR;  Service: Orthopedics;  Laterality: Left;   Patient Active Problem List   Diagnosis Date Noted   Status post total left knee replacement 10/14/2023   Melanoma (HCC) 02/08/2016   Regional lymph node metastasis present (HCC) 02/08/2016   Colitis 09/13/2015   H/O hepatitis 07/18/2015   Melanoma of forearm, left (HCC) 06/27/2015    PCP: Orest Bio, MD  REFERRING PROVIDER: Arnie Lao, MD  REFERRING DIAG: [phone] (ICD-10-CM) - Unilateral primary osteoarthritis, left knee   THERAPY DIAG:  Acute pain of left knee  Difficulty in walking, not elsewhere classified  Rationale for Evaluation and Treatment: Rehabilitation  ONSET DATE: 10/14/2023 - L TKA  SUBJECTIVE:   SUBJECTIVE STATEMENT: 11/11/23:  Pt arrived ambulating with SPC.  Reports he took pain  medication prior therapy today.  Has added the SAQ and heel prop to HEP.    Evaluation: Pt reports he has had multiple cortisone shots then got the TKA on 10/14/23 because he wants to be able to get back to doing things he was doing. Getting about 5 hours a night of sleep but has to take medication for it. Three hardest things to do: putting compression sock on, walking without a device, and getting in and out of the car because has to watch what he does. Had [facility] therapy and last time they checked it was 115 flexion.   PERTINENT HISTORY: Cancer, melanoma, colitis, lymphedema, DM, Lt UE lymphedema PAIN:  Are you having pain? Yes: NPRS scale: current 2 - worst (late in day when going to bed) 5 - best 0 (with medication and rest) Pain location: in the knee Pain description: more of a pinching type pain Aggravating factors: standing in one position, walking >300' prior to sitting Relieving factors: icing, resting, and moving it around  PRECAUTIONS: Knee - hx L TKA; fall risk. Lt UE lymphedema  RED FLAGS: CANCER    WEIGHT BEARING RESTRICTIONS: No  FALLS:  Has patient fallen in last 6 months? No  LIVING ENVIRONMENT: Lives with: lives with their spouse Lives in: House/apartment Stairs: Yes: External: 4 steps; on left going up  Has following equipment at home: Otho Blitz - 2 wheeled and shower chair  OCCUPATION: Prior worked on his knees with [employer]   PLOF: Independent with basic ADLs, Independent with gait, Independent with transfers, and Leisure: bowling, golf, lawn mowing  PATIENT GOALS: "Dorita Garter be able to go bowling, play golf, and get on and off lawn mower to mow grass."  NEXT MD VISIT: 2 weeks from 11/11/23  OBJECTIVE:  Note: Objective measures were completed at Evaluation unless otherwise noted.  PATIENT SURVEYS:  LEFS 19/80 - 23.8%  COGNITION: Overall cognitive status: Within functional limits for tasks assessed     SENSATION: WFL  EDEMA:  Swelling in knee noted  WNL  MUSCLE LENGTH: Hamstrings: Right slight limited deg; Left limited deg  PALPATION: ITB tightness noted; decreased quad activation on palpation (poor VMO activation noted)   LOWER EXTREMITY ROM:  Active / Passive ROM Right eval Left eval  Hip flexion    Hip extension    Hip abduction    Hip adduction    Hip internal rotation    Hip external rotation    Knee flexion 116 A 86 P 93  Knee extension  Lacking 5 A lacking 15 P lacking 10  Ankle dorsiflexion    Ankle plantarflexion    Ankle inversion    Ankle eversion     (Blank rows = not tested)  LOWER EXTREMITY MMT:  MMT Right eval Left eval  Hip flexion 4/5 3+/5  Hip extension 3+/5 3/5  Hip abduction    Hip adduction    Hip internal rotation    Hip external rotation    Knee flexion 4-/5   Knee extension 4-/5   Ankle dorsiflexion 4/5 4-/5  Ankle plantarflexion 4-/5 4-/5  Ankle inversion    Ankle eversion     (Blank rows = not tested)  FUNCTIONAL TESTS:  5 times sit to stand: 37s requiring BUE assist and demonstrates L LE anterior and emphasis on use of R LE  30 seconds chair stand test - 4 repetitions  GAIT: Distance walked: 224' Assistive device utilized: Environmental consultant - 2 wheeled Level of assistance: Modified independence (cues for heel strike and use of RW) Comments: poor functional flexion and lateral flexion for progression                                                                                                                              TREATMENT DATE:   11/11/23: Bike seat 15 rocking 5 minutes Standing:  Knee drive on 28UX step 5x 10"  Heel raise on incline slope 10x 3"  Toe raises on decline slope  TKE RTB 10x 5" Prone:  Prone knee hang x 2' (added to HEP) TKE 10x 5"   Supine:  SAQ 10x 5" Hamstring stretch 3x 30" AROM 7-94 degrees Bridge 10x 5"   11/05/23: Bike seat 15 rocking 5 minutes Standing:    Knee drive on 32GM step 5x 10"  Heel raise on incline  slope 10x 3"  Toe raises on  decline slope  TKE RTB 10x 5" Sit to stand with foam elevated height no HHA   Gait training with SPC SBA x 124ft, good 2 point sequence  Supine: SAQ 10x Heel slide AROM 8-91 degrees Hamstring stretch with rope 3x 30"   11/03/23 Goal review Bike seat 14 rocking 5 minutes Standing;  knee flexion stretch with 12" step 10X10"  Knee flexion 10X  Hamstring stretch with 12" step 4X20" Ambulation with SPC SBA Supine:  heel slides 10X5", AROM 90 degrees  Quad sets 10X5" AROM -10 degrees  10/30/23 - HEP prescription and demonstration - education on functional engagement of quad and heel slides as well   PATIENT EDUCATION:  Education details: HEP and continuing to perform functional flexion in order to improve gait safety and mechanics Person educated: Patient Education method: Medical illustrator Education comprehension: verbalized understanding and returned demonstration  HOME EXERCISE PROGRAM: Current HH exercises: SLR, heel slides, heel raises/toe raises, quad sets, hip abduction  Access Code: [email or web address] URL: [email or web address] Date: 10/30/2023 Prepared by: Bettey Mare  Exercises - Supine Bridge  - 1 x daily - 7 x weekly - 3 sets - 10 reps - Standing Knee Flexion Stretch on Step  - 1 x daily - 7 x weekly - 2 sets - 10 reps - 10s hold - Seated Hamstring Stretch  - 1 x daily - 7 x weekly - 1 sets - 8 reps - 10s hold  11/05/23: SAQ and leg prop for extension  11/11/23: Prone knee hang   ASSESSMENT:  CLINICAL IMPRESSION: Session focus with knee mobility and proximal strengthening.  Pt arrived this session with SPC, demonstrated good 2 point sequence and stable gait.  Added prone exercises to assist with knee extension, added to HEP.  Knee mobility is progressing, AROM 7-94 degrees.    Evaluation: Patient is a 68 y.o. male who was seen today for physical therapy evaluation and treatment for s/p L TKA on 10/14/23 with deficits including A/PROM deficits,  strength, safety with gait, and poor balance/coordination needed for improving functional safety and transfers as was in PLOF. Pt independent driving and performing household chores/yard work in [employer] and recommend continued skilled PT services.    OBJECTIVE IMPAIRMENTS: Abnormal gait, decreased activity tolerance, decreased balance, decreased cognition, decreased coordination, decreased endurance, decreased mobility, difficulty walking, decreased ROM, decreased strength, increased fascial restrictions, impaired flexibility, and pain.   ACTIVITY LIMITATIONS: carrying, lifting, bending, standing, squatting, stairs, and transfers  PARTICIPATION LIMITATIONS: meal prep, driving, community activity, and yard work  PERSONAL FACTORS: 1-2 comorbidities: hx cancer, lymphedema, and DM  are also affecting patient's functional outcome.   REHAB POTENTIAL: Good  CLINICAL DECISION MAKING: Evolving/moderate complexity  EVALUATION COMPLEXITY: Moderate   GOALS: Goals reviewed with patient? Yes  SHORT TERM GOALS: Target date: 11/29/23 Pt will be independent w/ HEP in order to demonstrate improved functional compliance toward ADL independence.  Baseline: some compliance w/ HH PT HEP Goal status: IN PROGRESS  2.  Pt will achieve L knee AROM (-5) to 100 for demonstrating progressions needed for stair navigation.  Baseline: (-15) - 86 degrees Goal status: IN PROGRESS  LONG TERM GOALS: Target date: 01/22/24  Pt will be able to ambulate >350' without use of AD and typical gait pattern in 2 minutes to indicate improved gait safety and independence.  Baseline: 224' w/ RW in 2 minutes Goal status: IN PROGRESS  2.  Pt will be able to navigate stairs with independence in  typical pattern.  Baseline: step to with use of rail and poor mechanics Goal status: IN PROGRESS  3.  Pt L knee ROM active from 0 - 120 degrees in order to indicate ROM needed for functional gait and stair navigation with safety.  Baseline:  (-15) - 86 Goal status: IN PROGRESS  4.  Pt will perform FTSTS in <15s with no use of UE to indicate improved functional BLE strength and endurance. Baseline: 37s w/ BUE needed Goal status: IN PROGRESS  PLAN:  PT FREQUENCY: 2x/week  PT DURATION: 12 weeks  PLANNED INTERVENTIONS: 97110-Therapeutic exercises, 97530- Therapeutic activity, [id number]- Neuromuscular re-education, 97535- Self Care, [id number]- Manual therapy, [phone]- Gait training, Patient/Family education, Balance training, and Stair training  PLAN FOR NEXT SESSION: Continue with primary focus on AROM, gait with LRAD then return to functional strengthening.   Minor Amble, LPTA/CLT; CBIS [phone]  Alesia Anchors, PTA 11/11/2023, 4:12 PM

## 2023-11-12 DIAGNOSIS — M542 Cervicalgia: Secondary | ICD-10-CM | POA: Diagnosis not present

## 2023-11-12 DIAGNOSIS — M9901 Segmental and somatic dysfunction of cervical region: Secondary | ICD-10-CM | POA: Diagnosis not present

## 2023-11-12 DIAGNOSIS — M9905 Segmental and somatic dysfunction of pelvic region: Secondary | ICD-10-CM | POA: Diagnosis not present

## 2023-11-12 DIAGNOSIS — M9902 Segmental and somatic dysfunction of thoracic region: Secondary | ICD-10-CM | POA: Diagnosis not present

## 2023-11-12 DIAGNOSIS — M9903 Segmental and somatic dysfunction of lumbar region: Secondary | ICD-10-CM | POA: Diagnosis not present

## 2023-11-12 DIAGNOSIS — M25562 Pain in left knee: Secondary | ICD-10-CM | POA: Diagnosis not present

## 2023-11-12 DIAGNOSIS — M546 Pain in thoracic spine: Secondary | ICD-10-CM | POA: Diagnosis not present

## 2023-11-12 DIAGNOSIS — M62830 Muscle spasm of back: Secondary | ICD-10-CM | POA: Diagnosis not present

## 2023-11-13 ENCOUNTER — Encounter (HOSPITAL_COMMUNITY): Payer: Self-pay

## 2023-11-13 ENCOUNTER — Ambulatory Visit (INDEPENDENT_AMBULATORY_CARE_PROVIDER_SITE_OTHER)

## 2023-11-13 DIAGNOSIS — M25562 Pain in left knee: Secondary | ICD-10-CM | POA: Diagnosis not present

## 2023-11-13 DIAGNOSIS — R262 Difficulty in walking, not elsewhere classified: Secondary | ICD-10-CM | POA: Diagnosis not present

## 2023-11-13 DIAGNOSIS — M1712 Unilateral primary osteoarthritis, left knee: Secondary | ICD-10-CM | POA: Diagnosis not present

## 2023-11-13 NOTE — Therapy (Signed)
 OUTPATIENT PHYSICAL THERAPY TREATMENT   Patient Name: Luis Fitzgerald MRN: [id number] DOB:[birth year], 68 y.o., male Today's Date: 11/13/2023  END OF SESSION:  PT End of Session - 11/13/23 0938     Visit Number 5    Number of Visits 12    Date for PT Re-Evaluation 01/22/24    Authorization Type U[employer] Medicare    Authorization Time Period uhc approved 6 visits from 10/30/2023-12/25/23    Authorization - Visit Number 4    Authorization - Number of Visits 6    Progress Note Due on Visit 7    PT Start Time 0935    PT Stop Time 1015    PT Time Calculation (min) 40 min    Activity Tolerance Patient tolerated treatment well    Behavior During Therapy WFL for tasks assessed/performed               Past Medical History:  Diagnosis Date   Cancer (HCC)    melanoma   Diabetes mellitus without complication (HCC)    Lymphedema    left arm   Melanoma (HCC)    left arm   Past Surgical History:  Procedure Laterality Date   EYE SURGERY Left    knee arthroscope Left    TOTAL KNEE ARTHROPLASTY Left 10/14/2023   Procedure: LEFT TOTAL KNEE ARTHROPLASTY;  Surgeon: Arnie Lao, MD;  Location: [facility] OR;  Service: Orthopedics;  Laterality: Left;   Patient Active Problem List   Diagnosis Date Noted   Status post total left knee replacement 10/14/2023   Melanoma (HCC) 02/08/2016   Regional lymph node metastasis present (HCC) 02/08/2016   Colitis 09/13/2015   H/O hepatitis 07/18/2015   Melanoma of forearm, left (HCC) 06/27/2015    PCP: Orest Bio, MD  REFERRING PROVIDER: Arnie Lao, MD  REFERRING DIAG: [phone] (ICD-10-CM) - Unilateral primary osteoarthritis, left knee   THERAPY DIAG:  Acute pain of left knee  Difficulty in walking, not elsewhere classified  Rationale for Evaluation and Treatment: Rehabilitation  ONSET DATE: 10/14/2023 - L TKA  SUBJECTIVE:   SUBJECTIVE STATEMENT: 11/13/23:  Has added new prone exercise to HEP, reports increase pain at  night.  Took pain meds prior session today so no reports of pain currently.  Pt has been walking some around the house without AD.  Evaluation: Pt reports he has had multiple cortisone shots then got the TKA on 10/14/23 because he wants to be able to get back to doing things he was doing. Getting about 5 hours a night of sleep but has to take medication for it. Three hardest things to do: putting compression sock on, walking without a device, and getting in and out of the car because has to watch what he does. Had [facility] therapy and last time they checked it was 115 flexion.   PERTINENT HISTORY: Cancer, melanoma, colitis, lymphedema, DM, Lt UE lymphedema PAIN:  Are you having pain? Yes: NPRS scale: current 0 - worst (late in day when going to bed) 5 - best 0 (with medication and rest) Pain location: in the knee Pain description: more of a pinching type pain Aggravating factors: standing in one position, walking >300' prior to sitting Relieving factors: icing, resting, and moving it around  PRECAUTIONS: Knee - hx L TKA; fall risk. Lt UE lymphedema  RED FLAGS: CANCER    WEIGHT BEARING RESTRICTIONS: No  FALLS:  Has patient fallen in last 6 months? No  LIVING ENVIRONMENT: Lives with: lives with their spouse Lives  in: House/apartment Stairs: Yes: External: 4 steps; on left going up Has following equipment at home: Otho Blitz - 2 wheeled and shower chair  OCCUPATION: Prior worked on his knees with [employer]   PLOF: Independent with basic ADLs, Independent with gait, Independent with transfers, and Leisure: bowling, golf, lawn mowing  PATIENT GOALS: "Gotta be able to go bowling, play golf, and get on and off lawn mower to mow grass."  NEXT MD VISIT: 2 weeks from 11/11/23  OBJECTIVE:  Note: Objective measures were completed at Evaluation unless otherwise noted.  PATIENT SURVEYS:  LEFS 19/80 - 23.8%  COGNITION: Overall cognitive status: Within functional limits for tasks  assessed     SENSATION: WFL  EDEMA:  Swelling in knee noted WNL  MUSCLE LENGTH: Hamstrings: Right slight limited deg; Left limited deg  PALPATION: ITB tightness noted; decreased quad activation on palpation (poor VMO activation noted)   LOWER EXTREMITY ROM:  Active / Passive ROM Right eval Left eval Lt knee  11/13/23  Hip flexion     Hip extension     Hip abduction     Hip adduction     Hip internal rotation     Hip external rotation     Knee flexion 116 A 86 P 93 105  Knee extension  Lacking 5 A lacking 15 P lacking 10 Lacking 5  Ankle dorsiflexion     Ankle plantarflexion     Ankle inversion     Ankle eversion      (Blank rows = not tested)  LOWER EXTREMITY MMT:  MMT Right eval Left eval  Hip flexion 4/5 3+/5  Hip extension 3+/5 3/5  Hip abduction    Hip adduction    Hip internal rotation    Hip external rotation    Knee flexion 4-/5   Knee extension 4-/5   Ankle dorsiflexion 4/5 4-/5  Ankle plantarflexion 4-/5 4-/5  Ankle inversion    Ankle eversion     (Blank rows = not tested)  FUNCTIONAL TESTS:  5 times sit to stand: 37s requiring BUE assist and demonstrates L LE anterior and emphasis on use of R LE  30 seconds chair stand test - 4 repetitions  GAIT: Distance walked: 224' Assistive device utilized: Environmental consultant - 2 wheeled Level of assistance: Modified independence (cues for heel strike and use of RW) Comments: poor functional flexion and lateral flexion for progression                                                                                                                              TREATMENT DATE:   11/13/23:  Bike seat 15 rocking 5 minutes, able to make full revolution posterior at the end Standing:  Knee drive on 40JW step 5x 20"  Heel raise on incline slope 15x 3"   Toe raises on decline slope 15x 3" STS standard chair height 5x no HHA Seated: LAQ 10x 5"  Rockerboard Df/Pf; R/L 2' each  Hamstring  stretch 3x 30" on 12 step  height  Gastroc stretch with slant board   Supine: AROM 5-105 degrees  11/11/23: Bike seat 15 rocking 5 minutes Standing:  Knee drive on 16XW step 5x 10"  Heel raise on incline slope 10x 3"  Toe raises on decline slope  TKE RTB 10x 5" Prone:  Prone knee hang x 2' (added to HEP) TKE 10x 5"   Supine:  SAQ 10x 5" Hamstring stretch 3x 30" AROM 7-94 degrees Bridge 10x 5"   11/05/23: Bike seat 15 rocking 5 minutes Standing:    Knee drive on 96EA step 5x 10"  Heel raise on incline slope 10x 3"  Toe raises on decline slope  TKE RTB 10x 5" Sit to stand with foam elevated height no HHA   Gait training with SPC SBA x 177ft, good 2 point sequence  Supine: SAQ 10x Heel slide AROM 8-91 degrees Hamstring stretch with rope 3x 30"   11/03/23 Goal review Bike seat 14 rocking 5 minutes Standing;  knee flexion stretch with 12" step 10X10"  Knee flexion 10X  Hamstring stretch with 12" step 4X20" Ambulation with SPC SBA Supine:  heel slides 10X5", AROM 90 degrees  Quad sets 10X5" AROM -10 degrees  10/30/23 - HEP prescription and demonstration - education on functional engagement of quad and heel slides as well   PATIENT EDUCATION:  Education details: HEP and continuing to perform functional flexion in order to improve gait safety and mechanics Person educated: Patient Education method: Medical illustrator Education comprehension: verbalized understanding and returned demonstration  HOME EXERCISE PROGRAM: Current HH exercises: SLR, heel slides, heel raises/toe raises, quad sets, hip abduction  Access Code: [email or web address] URL: [email or web address] Date: 10/30/2023 Prepared by: Bettey Mare  Exercises - Supine Bridge  - 1 x daily - 7 x weekly - 3 sets - 10 reps - Standing Knee Flexion Stretch on Step  - 1 x daily - 7 x weekly - 2 sets - 10 reps - 10s hold - Seated Hamstring Stretch  - 1 x daily - 7 x weekly - 1 sets - 8 reps - 10s hold  11/05/23: SAQ and  leg prop for extension  11/11/23: Prone knee hang   ASSESSMENT:  CLINICAL IMPRESSION: Session focus with knee mobility and proximal strengthening.  Added rockerboard to equalize weight bearing with gait and gait training to improve heel to toe mechanics.  Continued quad strengthening and stretches for ROM based exercises with positive results.  Improved AROM 5-105 degrees.    Evaluation: Patient is a 68 y.o. male who was seen today for physical therapy evaluation and treatment for s/p L TKA on 10/14/23 with deficits including A/PROM deficits, strength, safety with gait, and poor balance/coordination needed for improving functional safety and transfers as was in PLOF. Pt independent driving and performing household chores/yard work in [employer] and recommend continued skilled PT services.    OBJECTIVE IMPAIRMENTS: Abnormal gait, decreased activity tolerance, decreased balance, decreased cognition, decreased coordination, decreased endurance, decreased mobility, difficulty walking, decreased ROM, decreased strength, increased fascial restrictions, impaired flexibility, and pain.   ACTIVITY LIMITATIONS: carrying, lifting, bending, standing, squatting, stairs, and transfers  PARTICIPATION LIMITATIONS: meal prep, driving, community activity, and yard work  PERSONAL FACTORS: 1-2 comorbidities: hx cancer, lymphedema, and DM  are also affecting patient's functional outcome.   REHAB POTENTIAL: Good  CLINICAL DECISION MAKING: Evolving/moderate complexity  EVALUATION COMPLEXITY: Moderate   GOALS: Goals reviewed with patient? Yes  SHORT TERM GOALS: Target date: 11/29/23 Pt will be independent  w/ HEP in order to demonstrate improved functional compliance toward ADL independence.  Baseline: some compliance w/ HH PT HEP Goal status: IN PROGRESS  2.  Pt will achieve L knee AROM (-5) to 100 for demonstrating progressions needed for stair navigation.  Baseline: (-15) - 86 degrees Goal status: IN  PROGRESS  LONG TERM GOALS: Target date: 01/22/24  Pt will be able to ambulate >350' without use of AD and typical gait pattern in 2 minutes to indicate improved gait safety and independence.  Baseline: 224' w/ RW in 2 minutes Goal status: IN PROGRESS  2.  Pt will be able to navigate stairs with independence in typical pattern.  Baseline: step to with use of rail and poor mechanics Goal status: IN PROGRESS  3.  Pt L knee ROM active from 0 - 120 degrees in order to indicate ROM needed for functional gait and stair navigation with safety.  Baseline: (-15) - 86 Goal status: IN PROGRESS  4.  Pt will perform FTSTS in <15s with no use of UE to indicate improved functional BLE strength and endurance. Baseline: 37s w/ BUE needed Goal status: IN PROGRESS  PLAN:  PT FREQUENCY: 2x/week  PT DURATION: 12 weeks  PLANNED INTERVENTIONS: 97110-Therapeutic exercises, 97530- Therapeutic activity, [id number]- Neuromuscular re-education, 97535- Self Care, 29528- Manual therapy, [phone]- Gait training, Patient/Family education, Balance training, and Stair training  PLAN FOR NEXT SESSION: Continue with primary focus on AROM, gait with LRAD then return to functional strengthening.   Minor Amble, LPTA/CLT; CBIS [phone]  Alesia Anchors, PTA 11/13/2023, 12:12 PM

## 2023-11-19 ENCOUNTER — Ambulatory Visit (HOSPITAL_COMMUNITY): Admitting: Physical Therapy

## 2023-11-19 DIAGNOSIS — R262 Difficulty in walking, not elsewhere classified: Secondary | ICD-10-CM | POA: Diagnosis not present

## 2023-11-19 DIAGNOSIS — M25562 Pain in left knee: Secondary | ICD-10-CM

## 2023-11-19 DIAGNOSIS — M1712 Unilateral primary osteoarthritis, left knee: Secondary | ICD-10-CM | POA: Diagnosis not present

## 2023-11-19 NOTE — Therapy (Signed)
 OUTPATIENT PHYSICAL THERAPY TREATMENT   Patient Name: Luis Fitzgerald MRN: [id number] DOB:09/17/1955, 68 y.o., male Today's Date: 11/19/2023  END OF SESSION:  PT End of Session - 11/19/23 0949     Visit Number 6    Number of Visits 12    Date for PT Re-Evaluation 01/22/24    Authorization Type UHC Medicare    Authorization Time Period uhc approved 6 visits from 10/30/2023-12/25/23    Authorization - Number of Visits 7    Progress Note Due on Visit 7    PT Start Time 0848    PT Stop Time 0928    PT Time Calculation (min) 40 min    Activity Tolerance Patient tolerated treatment well    Behavior During Therapy [facility] for tasks assessed/performed                Past Medical History:  Diagnosis Date   Cancer (HCC)    melanoma   Diabetes mellitus without complication (HCC)    Lymphedema    left arm   Melanoma (HCC)    left arm   Past Surgical History:  Procedure Laterality Date   EYE SURGERY Left    knee arthroscope Left    TOTAL KNEE ARTHROPLASTY Left 10/14/2023   Procedure: LEFT TOTAL KNEE ARTHROPLASTY;  Surgeon: Arnie Lao, MD;  Location: [facility] OR;  Service: Orthopedics;  Laterality: Left;   Patient Active Problem List   Diagnosis Date Noted   Status post total left knee replacement 10/14/2023   Melanoma (HCC) 02/08/2016   Regional lymph node metastasis present (HCC) 02/08/2016   Colitis 09/13/2015   H/O hepatitis 07/18/2015   Melanoma of forearm, left (HCC) 06/27/2015    PCP: Orest Bio, MD  REFERRING PROVIDER: Arnie Lao, MD  REFERRING DIAG: [phone] (ICD-10-CM) - Unilateral primary osteoarthritis, left knee   THERAPY DIAG:  Acute pain of left knee  Difficulty in walking, not elsewhere classified  Rationale for Evaluation and Treatment: Rehabilitation  ONSET DATE: 10/14/2023 - L TKA  SUBJECTIVE:   SUBJECTIVE STATEMENT: 11/19/23:  pt states he is trying not to take his pain pills.  Took one this morning but first one since  Sunday.  Currently with stiffness.  Evaluation: Pt reports he has had multiple cortisone shots then got the TKA on 10/14/23 because he wants to be able to get back to doing things he was doing. Getting about 5 hours a night of sleep but has to take medication for it. Three hardest things to do: putting compression sock on, walking without a device, and getting in and out of the car because has to watch what he does. Had [facility] therapy and last time they checked it was 115 flexion.   PERTINENT HISTORY: Cancer, melanoma, colitis, lymphedema, DM, Lt UE lymphedema PAIN:  Are you having pain? Yes: NPRS scale: current 0 - worst (late in day when going to bed) 5 - best 0 (with medication and rest) Pain location: in the knee Pain description: more of a pinching type pain Aggravating factors: standing in one position, walking >300' prior to sitting Relieving factors: icing, resting, and moving it around  PRECAUTIONS: Knee - hx L TKA; fall risk. Lt UE lymphedema  RED FLAGS: CANCER    WEIGHT BEARING RESTRICTIONS: No  FALLS:  Has patient fallen in last 6 months? No  LIVING ENVIRONMENT: Lives with: lives with their spouse Lives in: House/apartment Stairs: Yes: External: 4 steps; on left going up Has following equipment at home: Otho Blitz -  2 wheeled and shower chair  OCCUPATION: Prior worked on his knees with [employer]   PLOF: Independent with basic ADLs, Independent with gait, Independent with transfers, and Leisure: bowling, golf, lawn mowing  PATIENT GOALS: "Gotta be able to go bowling, play golf, and get on and off lawn mower to mow grass."  NEXT MD VISIT: 2 weeks from 11/11/23  OBJECTIVE:  Note: Objective measures were completed at Evaluation unless otherwise noted.  PATIENT SURVEYS:  LEFS 19/80 - 23.8%  COGNITION: Overall cognitive status: Within functional limits for tasks assessed     SENSATION: WFL  EDEMA:  Swelling in knee noted WNL  MUSCLE LENGTH: Hamstrings: Right slight  limited deg; Left limited deg  PALPATION: ITB tightness noted; decreased quad activation on palpation (poor VMO activation noted)   LOWER EXTREMITY ROM:  Active / Passive ROM Right eval Left eval Lt knee  11/13/23  Hip flexion     Hip extension     Hip abduction     Hip adduction     Hip internal rotation     Hip external rotation     Knee flexion 116 A 86 P 93 105  Knee extension  Lacking 5 A lacking 15 P lacking 10 Lacking 5  Ankle dorsiflexion     Ankle plantarflexion     Ankle inversion     Ankle eversion      (Blank rows = not tested)  LOWER EXTREMITY MMT:  MMT Right eval Left eval  Hip flexion 4/5 3+/5  Hip extension 3+/5 3/5  Hip abduction    Hip adduction    Hip internal rotation    Hip external rotation    Knee flexion 4-/5   Knee extension 4-/5   Ankle dorsiflexion 4/5 4-/5  Ankle plantarflexion 4-/5 4-/5  Ankle inversion    Ankle eversion     (Blank rows = not tested)  FUNCTIONAL TESTS:  5 times sit to stand: 37s requiring BUE assist and demonstrates L LE anterior and emphasis on use of R LE  30 seconds chair stand test - 4 repetitions  GAIT: Distance walked: 224' Assistive device utilized: Environmental consultant - 2 wheeled Level of assistance: Modified independence (cues for heel strike and use of RW) Comments: poor functional flexion and lateral flexion for progression                                                                                                                              TREATMENT DATE:   11/19/23:  Bike seat 15 full revolutions bkwd then forward 5 minutes Standing:  Knee drive on 45WU step 98J 10"  Hamstring stretch 12" step 3X30"  Heel raise on incline slope 15x 3"   Toe raises on decline slope 15x 3"  Forward lunges onto 4" no UE 2X10 Lt leading STS standard chair height 10x no HHA  11/13/23:  Bike seat 15 rocking 5 minutes, able to make full revolution posterior at the end Standing:  Knee drive on 16XW step 5x 20"  Heel raise  on incline slope 15x 3"   Toe raises on decline slope 15x 3" STS standard chair height 5x no HHA Seated: LAQ 10x 5"  Rockerboard Df/Pf; R/L 2' each  Hamstring stretch 3x 30" on 12 step height  Gastroc stretch with slant board   Supine: AROM 5-105 degrees  11/11/23: Bike seat 15 rocking 5 minutes Standing:  Knee drive on 96EA step 5x 10"  Heel raise on incline slope 10x 3"  Toe raises on decline slope  TKE RTB 10x 5" Prone:  Prone knee hang x 2' (added to HEP) TKE 10x 5"   Supine:  SAQ 10x 5" Hamstring stretch 3x 30" AROM 7-94 degrees Bridge 10x 5"    PATIENT EDUCATION:  Education details: HEP and continuing to perform functional flexion in order to improve gait safety and mechanics Person educated: Patient Education method: Medical illustrator Education comprehension: verbalized understanding and returned demonstration  HOME EXERCISE PROGRAM: Current HH exercises: SLR, heel slides, heel raises/toe raises, quad sets, hip abduction  Access Code: [email or web address] URL: [email or web address] Date: 10/30/2023 Prepared by: Leonard Raker  Exercises - Supine Bridge  - 1 x daily - 7 x weekly - 3 sets - 10 reps - Standing Knee Flexion Stretch on Step  - 1 x daily - 7 x weekly - 2 sets - 10 reps - 10s hold - Seated Hamstring Stretch  - 1 x daily - 7 x weekly - 1 sets - 8 reps - 10s hold  11/05/23: SAQ and leg prop for extension  11/11/23: Prone knee hang   ASSESSMENT:  CLINICAL IMPRESSION: Began on bike today with patient being able to make full revolutions today following 10 backward revolutions.  Continued with focus on improving Lt knee flexion/extension as well as functional strengthening.  Added lunges onto 4" step without UE to work on stabilization.  Pt tolerated all activities well without pain or issues, minimal cues.  Pt will continue to benefit from skilled therapy to progress towards goals.     Evaluation: Patient is a 68 y.o. male who was seen  today for physical therapy evaluation and treatment for s/p L TKA on 10/14/23 with deficits including A/PROM deficits, strength, safety with gait, and poor balance/coordination needed for improving functional safety and transfers as was in PLOF. Pt independent driving and performing household chores/yard work in [employer] and recommend continued skilled PT services.    OBJECTIVE IMPAIRMENTS: Abnormal gait, decreased activity tolerance, decreased balance, decreased cognition, decreased coordination, decreased endurance, decreased mobility, difficulty walking, decreased ROM, decreased strength, increased fascial restrictions, impaired flexibility, and pain.   ACTIVITY LIMITATIONS: carrying, lifting, bending, standing, squatting, stairs, and transfers  PARTICIPATION LIMITATIONS: meal prep, driving, community activity, and yard work  PERSONAL FACTORS: 1-2 comorbidities: hx cancer, lymphedema, and DM  are also affecting patient's functional outcome.   REHAB POTENTIAL: Good  CLINICAL DECISION MAKING: Evolving/moderate complexity  EVALUATION COMPLEXITY: Moderate   GOALS: Goals reviewed with patient? Yes  SHORT TERM GOALS: Target date: 11/29/23 Pt will be independent w/ HEP in order to demonstrate improved functional compliance toward ADL independence.  Baseline: some compliance w/ HH PT HEP Goal status: IN PROGRESS  2.  Pt will achieve L knee AROM (-5) to 100 for demonstrating progressions needed for stair navigation.  Baseline: (-15) - 86 degrees Goal status: IN PROGRESS  LONG TERM GOALS: Target date: 01/22/24  Pt will be able to ambulate >350' without use of AD and typical gait pattern  in 2 minutes to indicate improved gait safety and independence.  Baseline: 224' w/ RW in 2 minutes Goal status: IN PROGRESS  2.  Pt will be able to navigate stairs with independence in typical pattern.  Baseline: step to with use of rail and poor mechanics Goal status: IN PROGRESS  3.  Pt L knee ROM active  from 0 - 120 degrees in order to indicate ROM needed for functional gait and stair navigation with safety.  Baseline: (-15) - 86 Goal status: IN PROGRESS  4.  Pt will perform FTSTS in <15s with no use of UE to indicate improved functional BLE strength and endurance. Baseline: 37s w/ BUE needed Goal status: IN PROGRESS  PLAN:  PT FREQUENCY: 2x/week  PT DURATION: 12 weeks  PLANNED INTERVENTIONS: 97110-Therapeutic exercises, 97530- Therapeutic activity, [id number]- Neuromuscular re-education, 97535- Self Care, 29562- Manual therapy, [phone]- Gait training, Patient/Family education, Balance training, and Stair training  PLAN FOR NEXT SESSION: Continue with primary focus on AROM, gait with LRAD then return to functional strengthening. Next session begin step ups and BAPS in standing.   Lorenso Romance, PTA/CLT [facility] Ph: [phone]  Lorenso Romance, PTA 11/19/2023, 10:27 AM

## 2023-11-21 ENCOUNTER — Other Ambulatory Visit: Payer: Self-pay | Admitting: Orthopaedic Surgery

## 2023-11-21 ENCOUNTER — Telehealth: Payer: Self-pay | Admitting: *Deleted

## 2023-11-21 MED ORDER — OXYCODONE HCL 5 MG PO TABS: 5.0000 mg | ORAL_TABLET | Freq: Four times a day (QID) | ORAL | 0 refills | Status: DC | PRN

## 2023-11-21 NOTE — Telephone Encounter (Signed)
 Patient called and requested additional pain medication. Taking sparingly and see you next week in office. Thank you.

## 2023-11-22 ENCOUNTER — Other Ambulatory Visit: Payer: Self-pay | Admitting: Orthopaedic Surgery

## 2023-11-25 ENCOUNTER — Encounter (HOSPITAL_COMMUNITY): Payer: Self-pay

## 2023-11-25 ENCOUNTER — Ambulatory Visit (HOSPITAL_COMMUNITY): Admitting: Physical Therapy

## 2023-11-25 DIAGNOSIS — M25562 Pain in left knee: Secondary | ICD-10-CM | POA: Diagnosis not present

## 2023-11-25 DIAGNOSIS — M1712 Unilateral primary osteoarthritis, left knee: Secondary | ICD-10-CM | POA: Diagnosis not present

## 2023-11-25 DIAGNOSIS — R262 Difficulty in walking, not elsewhere classified: Secondary | ICD-10-CM

## 2023-11-25 NOTE — Therapy (Signed)
 OUTPATIENT PHYSICAL THERAPY TREATMENT Progress Note/DISCHARGE Reporting Period 10/30/23 to 11/25/23  See note below for Objective Data and Assessment of Progress/Goals.      Patient Name: Luis Fitzgerald MRN: [id number] DOB:08/22/1955, 68 y.o., male Today's Date: 11/25/2023  END OF SESSION:  PT End of Session - 11/25/23 1204     Visit Number 7    Number of Visits 12    Date for PT Re-Evaluation 01/22/24    Authorization Type [employer] Medicare    Authorization Time Period uhc approved 6 visits from 10/30/2023-12/25/23    Authorization - Visit Number 7    Authorization - Number of Visits 7    Progress Note Due on Visit 7    PT Start Time 1100    PT Stop Time 1143    PT Time Calculation (min) 43 min    Activity Tolerance Patient tolerated treatment well    Behavior During Therapy WFL for tasks assessed/performed                 Past Medical History:  Diagnosis Date   Cancer (HCC)    melanoma   Diabetes mellitus without complication (HCC)    Lymphedema    left arm   Melanoma (HCC)    left arm   Past Surgical History:  Procedure Laterality Date   EYE SURGERY Left    knee arthroscope Left    TOTAL KNEE ARTHROPLASTY Left 10/14/2023   Procedure: LEFT TOTAL KNEE ARTHROPLASTY;  Surgeon: Arnie Lao, MD;  Location: [facility] OR;  Service: Orthopedics;  Laterality: Left;   Patient Active Problem List   Diagnosis Date Noted   Status post total left knee replacement 10/14/2023   Melanoma (HCC) 02/08/2016   Regional lymph node metastasis present (HCC) 02/08/2016   Colitis 09/13/2015   H/O hepatitis 07/18/2015   Melanoma of forearm, left (HCC) 06/27/2015    PCP: Orest Bio, MD  REFERRING PROVIDER: Arnie Lao, MD  REFERRING DIAG: [id number] (ICD-10-CM) - Unilateral primary osteoarthritis, left knee   THERAPY DIAG:  Acute pain of left knee  Difficulty in walking, not elsewhere classified  Rationale for Evaluation and Treatment:  Rehabilitation  ONSET DATE: 10/14/2023 - L TKA  SUBJECTIVE:   SUBJECTIVE STATEMENT: 11/25/23:  pt reports very little pain in Lt knee, mostly painfree with 3/10 at worst.  Reports he is at least 90% better.  Ambulating community distances without AD.  Mowed his grass yesterday. Returns to MD tomorrow.   Evaluation: Pt reports he has had multiple cortisone shots then got the TKA on 10/14/23 because he wants to be able to get back to doing things he was doing. Getting about 5 hours a night of sleep but has to take medication for it. Three hardest things to do: putting compression sock on, walking without a device, and getting in and out of the car because has to watch what he does. Had [facility] therapy and last time they checked it was 115 flexion.   PERTINENT HISTORY: Cancer, melanoma, colitis, lymphedema, DM, Lt UE lymphedema PAIN:  Are you having pain? Yes: NPRS scale: current 0 - worst (late in day when going to bed) 5 - best 0 (with medication and rest) Pain location: in the knee Pain description: more of a pinching type pain Aggravating factors: standing in one position, walking >300' prior to sitting Relieving factors: icing, resting, and moving it around  PRECAUTIONS: Knee - hx L TKA; fall risk. Lt UE lymphedema  RED FLAGS: CANCER  WEIGHT BEARING RESTRICTIONS: No  FALLS:  Has patient fallen in last 6 months? No  LIVING ENVIRONMENT: Lives with: lives with their spouse Lives in: House/apartment Stairs: Yes: External: 4 steps; on left going up Has following equipment at home: Otho Blitz - 2 wheeled and shower chair  OCCUPATION: Prior worked on his knees with [employer]   PLOF: Independent with basic ADLs, Independent with gait, Independent with transfers, and Leisure: bowling, golf, lawn mowing  PATIENT GOALS: "Gotta be able to go bowling, play golf, and get on and off lawn mower to mow grass."  NEXT MD VISIT: 2 weeks from 11/11/23  OBJECTIVE:  Note: Objective measures were  completed at Evaluation unless otherwise noted.  PATIENT SURVEYS:  LEFS 19/80 - 23.8%  COGNITION: Overall cognitive status: Within functional limits for tasks assessed     SENSATION: WFL  EDEMA:  Swelling in knee noted WNL  MUSCLE LENGTH: Hamstrings: Right slight limited deg; Left limited deg  PALPATION: ITB tightness noted; decreased quad activation on palpation (poor VMO activation noted)   LOWER EXTREMITY ROM:  Active / Passive ROM Right eval Left eval Lt knee  11/13/23 Lt knee 11/25/23  Hip flexion      Hip extension      Hip abduction      Hip adduction      Hip internal rotation      Hip external rotation      Knee flexion 116 A 86 P 93 105 117  Knee extension  Lacking 5 A lacking 15 P lacking 10 Lacking 5 -5  Ankle dorsiflexion      Ankle plantarflexion      Ankle inversion      Ankle eversion       (Blank rows = not tested)  LOWER EXTREMITY MMT:  MMT Right eval Left eval Right 11/25/23 Left 11/25/23  Hip flexion 4/5 3+/5 5 5   Hip extension 3+/5 3/5 4+ 4+  Hip abduction      Hip adduction      Hip internal rotation      Hip external rotation      Knee flexion 4-/5  5 5   Knee extension 4-/5     Ankle dorsiflexion 4/5 4-/5 5 5   Ankle plantarflexion 4-/5 4-/5    Ankle inversion      Ankle eversion       (Blank rows = not tested)  FUNCTIONAL TESTS:  5 times sit to stand:  11/25/23: 14.28 no UE assist (at eval 37s requiring BUE assist and demonstrates L LE anterior and emphasis on use of R LE)  30 seconds chair stand test - at evaluation 4 repetitions)  GAIT: Distance walked: 224' Assistive device utilized: Environmental consultant - 2 wheeled Level of assistance: Modified independence (cues for heel strike and use of RW) Comments: poor functional flexion and lateral flexion for progression  TREATMENT DATE:   11/25/23 Functional test  measures  5 times sit to stand:  14.28(at eval 37s requiring BUE assist and demonstrates L LE anterior and emphasis on use of R LE)   436 feet no AD MMT/ROM see above Goal review  Lower Extremity Functional Scale Summary 1. Any of usual work, housework or school activities.A little bit of difficulty with usual work, housework or school activities(3 points) 2. Usual hobbies, recreational or sporting activities.Moderate difficulty with usual hobbies, recreational or sporting activities(2 points) 3. Getting into or out of the bath No difficulty getting into or out of the bath(4 points) 4. Walking between rooms.No difficulty walking between rooms(4 points) 5. Putting on your shoes or socks.A little bit of difficulty putting on shoes or socks(3 points) 6. Squatting.A little bit of difficulty squatting(3 points) 7. Lifting an object from the floor.No difficulty lifting an object from the floor (4 points) 8. Performing light activities around home.A little bit of difficulty performing light activities around home(3 points) 9. Performing heavy activities around home.Moderate difficulty performing heavy activities around home(2 points) 10. Getting into or out of a car.A little bit of difficulty getting into or out of a car(3 points) 11. Walking 2 blocks.No difficulty walking 2 blocks(4 points) 12. Walking a mile.A little bit of difficulty walking a mile(3 points) 13. Going up or down 10 stairs.A little bit of difficulty going up or down 10 stairs(3 points) 14. Standing for 1 hour.Moderate difficulty standing for 1 hour(2 points) 15. Sitting for 1 hour.No difficulty sitting for 1 hour(4 points) 16. Running on even ground.A lot of difficulty running on even ground(0 points) 17. Running on uneven ground.A lot of difficulty running on uneven ground(0 points) 18. Making sharp turns while running fast.A lot of difficulty making sharp turns while running fast(0 points) 19. Hopping.A lot of difficulty  hopping(0 points) 20. Rolling over in bed.A little bit of difficulty rolling over in bed(3 points) Lower Extremity Functional Score: 50/80=62.5 percent  11/19/23:  Bike seat 15 full revolutions bkwd then forward 5 minutes Standing:  Knee drive on 88CZ step 66A 10"  Hamstring stretch 12" step 3X30"  Heel raise on incline slope 15x 3"   Toe raises on decline slope 15x 3"  Forward lunges onto 4" no UE 2X10 Lt leading STS standard chair height 10x no HHA  11/13/23:  Bike seat 15 rocking 5 minutes, able to make full revolution posterior at the end Standing:  Knee drive on 63KZ step 5x 20"  Heel raise on incline slope 15x 3"   Toe raises on decline slope 15x 3" STS standard chair height 5x no HHA Seated: LAQ 10x 5"  Rockerboard Df/Pf; R/L 2' each  Hamstring stretch 3x 30" on 12 step height  Gastroc stretch with slant board   Supine: AROM 5-105 degrees     PATIENT EDUCATION:  Education details: HEP and continuing to perform functional flexion in order to improve gait safety and mechanics Person educated: Patient Education method: Medical illustrator Education comprehension: verbalized understanding and returned demonstration  HOME EXERCISE PROGRAM: Current HH exercises: SLR, heel slides, heel raises/toe raises, quad sets, hip abduction  Access Code: [email or web address] URL: [email or web address] Date: 10/30/2023 Prepared by: Leonard Raker  Exercises - Supine Bridge  - 1 x daily - 7 x weekly - 3 sets - 10 reps - Standing Knee Flexion Stretch on Step  - 1 x daily - 7 x weekly - 2 sets - 10 reps - 10s hold - Seated Hamstring Stretch  -  1 x daily - 7 x weekly - 1 sets - 8 reps - 10s hold  11/05/23: SAQ and leg prop for extension  11/11/23: Prone knee hang   ASSESSMENT:  CLINICAL IMPRESSION: Progress note/re-evalatuion completed today.  PT overall improved with some residual stiffness and higher function limitations.  Pt has met all goals with exception of AROM  which remains limited at -5 to 117, however RT knee is also limited in extension.  Strength has improved to functional and is able to negotiate stairs with 1 HR reciprocally without pain or issues.  Pt is agreeable to discharge at this time as he intends on returning to the gym and keeping active.  No other skilled needs or questions on this date.     Evaluation: Patient is a 68 y.o. male who was seen today for physical therapy evaluation and treatment for s/p L TKA on 10/14/23 with deficits including A/PROM deficits, strength, safety with gait, and poor balance/coordination needed for improving functional safety and transfers as was in PLOF. Pt independent driving and performing household chores/yard work in [employer] and recommend continued skilled PT services.    OBJECTIVE IMPAIRMENTS: Abnormal gait, decreased activity tolerance, decreased balance, decreased cognition, decreased coordination, decreased endurance, decreased mobility, difficulty walking, decreased ROM, decreased strength, increased fascial restrictions, impaired flexibility, and pain.   ACTIVITY LIMITATIONS: carrying, lifting, bending, standing, squatting, stairs, and transfers  PARTICIPATION LIMITATIONS: meal prep, driving, community activity, and yard work  PERSONAL FACTORS: 1-2 comorbidities: hx cancer, lymphedema, and DM  are also affecting patient's functional outcome.   REHAB POTENTIAL: Good  CLINICAL DECISION MAKING: Evolving/moderate complexity  EVALUATION COMPLEXITY: Moderate   GOALS: Goals reviewed with patient? Yes  SHORT TERM GOALS: Target date: 11/29/23 Pt will be independent w/ HEP in order to demonstrate improved functional compliance toward ADL independence.  Baseline: some compliance w/ HH PT HEP Goal status: MET  2.  Pt will achieve L knee AROM (-5) to 100 for demonstrating progressions needed for stair navigation.  Baseline: (-15) - 86 degrees Goal status: MET  LONG TERM GOALS: Target date: 01/22/24  Pt  will be able to ambulate >350' without use of AD and typical gait pattern in 2 minutes to indicate improved gait safety and independence.  Baseline: 224' w/ RW in 2 minutes Goal status: MET  2.  Pt will be able to navigate stairs with independence in typical pattern.  Baseline: step to with use of rail and poor mechanics Goal status: MET  3.  Pt L knee ROM active from 0 - 120 degrees in order to indicate ROM needed for functional gait and stair navigation with safety.  Baseline: (-15) - 86 Goal status: NOT MET (-8 to 117 AROM)  4.  Pt will perform FTSTS in <15s with no use of UE to indicate improved functional BLE strength and endurance. Baseline: 37s w/ BUE needed Goal status: MET  PLAN:  PT FREQUENCY: 2x/week  PT DURATION: 12 weeks  PLANNED INTERVENTIONS: 97110-Therapeutic exercises, 97530- Therapeutic activity, [id number]- Neuromuscular re-education, 97535- Self Care, 40347- Manual therapy, [phone]- Gait training, Patient/Family education, Balance training, and Stair training  PLAN FOR NEXT SESSION: Discharge as all goals met with exception of AROM.  Lorenso Romance, PTA/CLT [facility] Ph: [phone]  Lorenso Romance, PTA 11/25/2023, 12:05 PM

## 2023-11-25 NOTE — Therapy (Signed)
 [facility] Outpatient Rehabilitation at [facility] [address] [place], [place], [address] Phone: [phone]   Fax:  [phone]  Patient Details  Name: JONMICHAEL BELTRAME MRN: [id number] Date of Birth: 07/07/1956 Referring Provider:  No ref. provider found  Encounter Date: 11/25/2023  PHYSICAL THERAPY DISCHARGE SUMMARY  Visits from Start of Care: 7  Current functional level related to goals / functional outcomes: Pt met all goals except for ROM goals with which he was educated on continuing to perform. Pt reports 90% improvement.    Remaining deficits: ROM limitations   Education / Equipment: Education on ROM continued progression with home strategies   Patient agrees to discharge. Patient goals were partially met. Patient is being discharged due to being pleased with the current functional level.   Lavaun Porto, PT 11/25/2023, 3:20 PM  [facility] Outpatient Rehabilitation at [facility] [address] [place], [place], [address] Phone: [phone]   Fax:  [phone]

## 2023-11-26 ENCOUNTER — Ambulatory Visit (INDEPENDENT_AMBULATORY_CARE_PROVIDER_SITE_OTHER): Admitting: Orthopaedic Surgery

## 2023-11-26 DIAGNOSIS — Z96652 Presence of left artificial knee joint: Secondary | ICD-10-CM

## 2023-11-26 NOTE — Progress Notes (Signed)
 The patient is 6-week status post a left total knee replacement.  He has made great progress with therapy who have already released him.  He is doing well overall except for not sleeping well at night.  He reports much improvement in range of motion from when I saw him at his first postoperative visit.  On my exam today there is still moderate swelling of his left knee to be expected at 6 weeks.  He lacks full extension about 3 degrees and I can flex him to past to 100 degrees.  This is much improvement from his last visit.  He will continue increase his activities as comfort allows.  The next time we need to see him is not for 3 months.  Will have a standing AP and lateral of his left knee at that visit.

## 2023-11-27 ENCOUNTER — Encounter (HOSPITAL_COMMUNITY)

## 2023-12-02 ENCOUNTER — Encounter (HOSPITAL_COMMUNITY): Admitting: Physical Therapy

## 2023-12-04 ENCOUNTER — Encounter (HOSPITAL_COMMUNITY)

## 2023-12-09 ENCOUNTER — Encounter (HOSPITAL_COMMUNITY): Admitting: Physical Therapy

## 2023-12-11 ENCOUNTER — Encounter (HOSPITAL_COMMUNITY): Admitting: Physical Therapy

## 2023-12-16 ENCOUNTER — Encounter (HOSPITAL_COMMUNITY): Admitting: Physical Therapy

## 2023-12-17 DIAGNOSIS — M9901 Segmental and somatic dysfunction of cervical region: Secondary | ICD-10-CM | POA: Diagnosis not present

## 2023-12-17 DIAGNOSIS — M62830 Muscle spasm of back: Secondary | ICD-10-CM | POA: Diagnosis not present

## 2023-12-17 DIAGNOSIS — M25562 Pain in left knee: Secondary | ICD-10-CM | POA: Diagnosis not present

## 2023-12-17 DIAGNOSIS — M546 Pain in thoracic spine: Secondary | ICD-10-CM | POA: Diagnosis not present

## 2023-12-17 DIAGNOSIS — M9903 Segmental and somatic dysfunction of lumbar region: Secondary | ICD-10-CM | POA: Diagnosis not present

## 2023-12-17 DIAGNOSIS — M9905 Segmental and somatic dysfunction of pelvic region: Secondary | ICD-10-CM | POA: Diagnosis not present

## 2023-12-17 DIAGNOSIS — M542 Cervicalgia: Secondary | ICD-10-CM | POA: Diagnosis not present

## 2023-12-17 DIAGNOSIS — M9902 Segmental and somatic dysfunction of thoracic region: Secondary | ICD-10-CM | POA: Diagnosis not present

## 2023-12-18 ENCOUNTER — Encounter (HOSPITAL_COMMUNITY): Admitting: Physical Therapy

## 2023-12-23 ENCOUNTER — Encounter (HOSPITAL_COMMUNITY)

## 2024-01-14 DIAGNOSIS — M62830 Muscle spasm of back: Secondary | ICD-10-CM | POA: Diagnosis not present

## 2024-01-14 DIAGNOSIS — M9905 Segmental and somatic dysfunction of pelvic region: Secondary | ICD-10-CM | POA: Diagnosis not present

## 2024-01-14 DIAGNOSIS — M546 Pain in thoracic spine: Secondary | ICD-10-CM | POA: Diagnosis not present

## 2024-01-14 DIAGNOSIS — M9903 Segmental and somatic dysfunction of lumbar region: Secondary | ICD-10-CM | POA: Diagnosis not present

## 2024-01-14 DIAGNOSIS — M542 Cervicalgia: Secondary | ICD-10-CM | POA: Diagnosis not present

## 2024-01-14 DIAGNOSIS — M25562 Pain in left knee: Secondary | ICD-10-CM | POA: Diagnosis not present

## 2024-01-14 DIAGNOSIS — M9901 Segmental and somatic dysfunction of cervical region: Secondary | ICD-10-CM | POA: Diagnosis not present

## 2024-01-14 DIAGNOSIS — M9902 Segmental and somatic dysfunction of thoracic region: Secondary | ICD-10-CM | POA: Diagnosis not present

## 2024-02-23 DIAGNOSIS — E1140 Type 2 diabetes mellitus with diabetic neuropathy, unspecified: Secondary | ICD-10-CM | POA: Diagnosis not present

## 2024-02-23 DIAGNOSIS — E1165 Type 2 diabetes mellitus with hyperglycemia: Secondary | ICD-10-CM | POA: Diagnosis not present

## 2024-02-23 DIAGNOSIS — L309 Dermatitis, unspecified: Secondary | ICD-10-CM | POA: Diagnosis not present

## 2024-02-23 DIAGNOSIS — E7849 Other hyperlipidemia: Secondary | ICD-10-CM | POA: Diagnosis not present

## 2024-02-23 DIAGNOSIS — E875 Hyperkalemia: Secondary | ICD-10-CM | POA: Diagnosis not present

## 2024-02-25 ENCOUNTER — Encounter: Payer: Self-pay | Admitting: Orthopaedic Surgery

## 2024-02-25 ENCOUNTER — Other Ambulatory Visit (INDEPENDENT_AMBULATORY_CARE_PROVIDER_SITE_OTHER)

## 2024-02-25 ENCOUNTER — Ambulatory Visit (INDEPENDENT_AMBULATORY_CARE_PROVIDER_SITE_OTHER): Admitting: Orthopaedic Surgery

## 2024-02-25 DIAGNOSIS — Z96652 Presence of left artificial knee joint: Secondary | ICD-10-CM

## 2024-02-25 NOTE — Progress Notes (Signed)
 The patient is an active 69 year old gentleman who is now 4 months status post a left press-fit total knee arthroplasty.  He says he has excellent range of motion and strength in that knee and is doing well overall.  He has known arthritis of the right knee but it is still not bothering him enough to pursue any type of intervention for that knee.  Examination of his left knee today shows his range of motion is entirely full.  There is only minimal swelling of his knee and it feels ligamentously stable.  The right knee has more varus malalignment and a little bit more swelling and less motion as well but is not significantly painful to him yet.  X-rays of the left knee today show well-seated press-fit total knee arthroplasty with no complicating features.  The next time we need to see him is not for 6 months unless he is having issues.  At that visit we will have a standing AP and lateral of his left knee.  If his right knee worsens at any point he knows to reach out to us  and come in for an injection if needed versus eventually knee replacement if it bothers him enough.

## 2024-02-26 DIAGNOSIS — L255 Unspecified contact dermatitis due to plants, except food: Secondary | ICD-10-CM | POA: Diagnosis not present

## 2024-03-01 DIAGNOSIS — E782 Mixed hyperlipidemia: Secondary | ICD-10-CM | POA: Diagnosis not present

## 2024-03-01 DIAGNOSIS — E875 Hyperkalemia: Secondary | ICD-10-CM | POA: Diagnosis not present

## 2024-03-01 DIAGNOSIS — Z85820 Personal history of malignant melanoma of skin: Secondary | ICD-10-CM | POA: Diagnosis not present

## 2024-03-02 ENCOUNTER — Encounter: Payer: Self-pay | Admitting: *Deleted

## 2024-03-03 DIAGNOSIS — M62830 Muscle spasm of back: Secondary | ICD-10-CM | POA: Diagnosis not present

## 2024-03-03 DIAGNOSIS — M9901 Segmental and somatic dysfunction of cervical region: Secondary | ICD-10-CM | POA: Diagnosis not present

## 2024-03-03 DIAGNOSIS — M9903 Segmental and somatic dysfunction of lumbar region: Secondary | ICD-10-CM | POA: Diagnosis not present

## 2024-03-03 DIAGNOSIS — M546 Pain in thoracic spine: Secondary | ICD-10-CM | POA: Diagnosis not present

## 2024-03-03 DIAGNOSIS — M25562 Pain in left knee: Secondary | ICD-10-CM | POA: Diagnosis not present

## 2024-03-03 DIAGNOSIS — M9902 Segmental and somatic dysfunction of thoracic region: Secondary | ICD-10-CM | POA: Diagnosis not present

## 2024-03-03 DIAGNOSIS — M542 Cervicalgia: Secondary | ICD-10-CM | POA: Diagnosis not present

## 2024-03-03 DIAGNOSIS — M9905 Segmental and somatic dysfunction of pelvic region: Secondary | ICD-10-CM | POA: Diagnosis not present

## 2024-03-10 DIAGNOSIS — M546 Pain in thoracic spine: Secondary | ICD-10-CM | POA: Diagnosis not present

## 2024-03-10 DIAGNOSIS — M9901 Segmental and somatic dysfunction of cervical region: Secondary | ICD-10-CM | POA: Diagnosis not present

## 2024-03-10 DIAGNOSIS — M542 Cervicalgia: Secondary | ICD-10-CM | POA: Diagnosis not present

## 2024-03-10 DIAGNOSIS — M9903 Segmental and somatic dysfunction of lumbar region: Secondary | ICD-10-CM | POA: Diagnosis not present

## 2024-03-10 DIAGNOSIS — M62830 Muscle spasm of back: Secondary | ICD-10-CM | POA: Diagnosis not present

## 2024-03-10 DIAGNOSIS — M9905 Segmental and somatic dysfunction of pelvic region: Secondary | ICD-10-CM | POA: Diagnosis not present

## 2024-03-10 DIAGNOSIS — M9902 Segmental and somatic dysfunction of thoracic region: Secondary | ICD-10-CM | POA: Diagnosis not present

## 2024-03-10 DIAGNOSIS — M25562 Pain in left knee: Secondary | ICD-10-CM | POA: Diagnosis not present

## 2024-03-17 DIAGNOSIS — M542 Cervicalgia: Secondary | ICD-10-CM | POA: Diagnosis not present

## 2024-03-17 DIAGNOSIS — M9901 Segmental and somatic dysfunction of cervical region: Secondary | ICD-10-CM | POA: Diagnosis not present

## 2024-03-17 DIAGNOSIS — M25562 Pain in left knee: Secondary | ICD-10-CM | POA: Diagnosis not present

## 2024-03-17 DIAGNOSIS — M9902 Segmental and somatic dysfunction of thoracic region: Secondary | ICD-10-CM | POA: Diagnosis not present

## 2024-03-17 DIAGNOSIS — M9905 Segmental and somatic dysfunction of pelvic region: Secondary | ICD-10-CM | POA: Diagnosis not present

## 2024-03-17 DIAGNOSIS — M62830 Muscle spasm of back: Secondary | ICD-10-CM | POA: Diagnosis not present

## 2024-03-17 DIAGNOSIS — M9903 Segmental and somatic dysfunction of lumbar region: Secondary | ICD-10-CM | POA: Diagnosis not present

## 2024-03-17 DIAGNOSIS — M546 Pain in thoracic spine: Secondary | ICD-10-CM | POA: Diagnosis not present

## 2024-03-19 ENCOUNTER — Other Ambulatory Visit: Payer: Self-pay | Admitting: *Deleted

## 2024-03-19 DIAGNOSIS — Z1211 Encounter for screening for malignant neoplasm of colon: Secondary | ICD-10-CM

## 2024-03-22 DIAGNOSIS — M9905 Segmental and somatic dysfunction of pelvic region: Secondary | ICD-10-CM | POA: Diagnosis not present

## 2024-03-22 DIAGNOSIS — M62830 Muscle spasm of back: Secondary | ICD-10-CM | POA: Diagnosis not present

## 2024-03-22 DIAGNOSIS — M9902 Segmental and somatic dysfunction of thoracic region: Secondary | ICD-10-CM | POA: Diagnosis not present

## 2024-03-22 DIAGNOSIS — M542 Cervicalgia: Secondary | ICD-10-CM | POA: Diagnosis not present

## 2024-03-22 DIAGNOSIS — M25562 Pain in left knee: Secondary | ICD-10-CM | POA: Diagnosis not present

## 2024-03-22 DIAGNOSIS — M9901 Segmental and somatic dysfunction of cervical region: Secondary | ICD-10-CM | POA: Diagnosis not present

## 2024-03-22 DIAGNOSIS — M9903 Segmental and somatic dysfunction of lumbar region: Secondary | ICD-10-CM | POA: Diagnosis not present

## 2024-03-22 DIAGNOSIS — M546 Pain in thoracic spine: Secondary | ICD-10-CM | POA: Diagnosis not present

## 2024-03-25 ENCOUNTER — Ambulatory Visit: Admitting: General Surgery

## 2024-03-25 ENCOUNTER — Encounter: Payer: Self-pay | Admitting: General Surgery

## 2024-03-25 VITALS — BP 134/85 | HR 70 | Temp 98.0°F | Resp 14 | Ht 69.0 in | Wt 218.0 lb

## 2024-03-25 DIAGNOSIS — Z1211 Encounter for screening for malignant neoplasm of colon: Secondary | ICD-10-CM

## 2024-03-25 MED ORDER — SUTAB 1479-225-188 MG PO TABS: 24.0000 | ORAL_TABLET | Freq: Once | ORAL | 0 refills | Status: AC

## 2024-03-25 NOTE — Progress Notes (Signed)
 Luis Fitzgerald; [id number]; 1955/10/09   HPI Patient is a 68 year old white male who was referred to my care by Deward Soja for a screening colonoscopy.  He last had a colonoscopy over 10 years ago.  He denies any family history of colon cancer, blood in his stools, abnormal diarrhea or constipation, or weight change.  He does have a history of a melanoma of his left arm resulting in lymphedema of the left arm. Past Medical History:  Diagnosis Date   Cancer (HCC)    melanoma   Diabetes mellitus without complication (HCC)    Lymphedema    left arm   Melanoma (HCC)    left arm    Past Surgical History:  Procedure Laterality Date   EYE SURGERY Left    knee arthroscope Left    TOTAL KNEE ARTHROPLASTY Left 10/14/2023   Procedure: LEFT TOTAL KNEE ARTHROPLASTY;  Surgeon: Vernetta Lonni GRADE, MD;  Location: [facility] OR;  Service: Orthopedics;  Laterality: Left;    History reviewed. No pertinent family history.  Current Outpatient Medications on File Prior to Visit  Medication Sig Dispense Refill   acetaminophen  (TYLENOL ) 500 MG tablet Take 500-1,000 mg by mouth every 8 (eight) hours as needed for mild pain (pain score 1-3) or moderate pain (pain score 4-6).     aspirin  81 MG chewable tablet Chew 1 tablet (81 mg total) by mouth 2 (two) times daily. 30 tablet 0   atorvastatin (LIPITOR) 10 MG tablet Take 10 mg by mouth at bedtime.     celecoxib (CELEBREX) 200 MG capsule Take 200 mg by mouth in the morning.     glipiZIDE  (GLUCOTROL ) 10 MG tablet Take 5 mg by mouth 2 (two) times daily before a meal.     metFORMIN  (GLUCOPHAGE -XR) 500 MG 24 hr tablet Take 1,000 mg by mouth 2 (two) times daily with a meal.     OZEMPIC, 0.25 OR 0.5 MG/DOSE, 2 MG/3ML SOPN Inject 0.5 mg into the skin once a week.     No current facility-administered medications on file prior to visit.    Allergies  Allergen Reactions   Other Other (See Comments)    Pecans -no other nuts    Social History   Substance and  Sexual Activity  Alcohol  Use No    Social History   Tobacco Use  Smoking Status Never  Smokeless Tobacco Never    Review of Systems  Constitutional: Negative.   HENT: Negative.    Eyes: Negative.   Respiratory: Negative.    Cardiovascular: Negative.   Gastrointestinal: Negative.   Genitourinary:  Positive for urgency.  Musculoskeletal: Negative.   Skin: Negative.   Neurological: Negative.   Endo/Heme/Allergies: Negative.   Psychiatric/Behavioral: Negative.      Objective   Vitals:   03/25/24 1010  BP: 134/85  Pulse: 70  Resp: 14  Temp: 98 F (36.7 C)  SpO2: 96%    Physical Exam Vitals reviewed.  Constitutional:      Appearance: Normal appearance. He is not ill-appearing.  HENT:     Head: Normocephalic and atraumatic.  Cardiovascular:     Rate and Rhythm: Normal rate and regular rhythm.     Heart sounds: Normal heart sounds. No murmur heard.    No friction rub. No gallop.  Pulmonary:     Effort: Pulmonary effort is normal. No respiratory distress.     Breath sounds: Normal breath sounds. No stridor. No wheezing or rhonchi.  Abdominal:     General: Bowel sounds are normal.  There is no distension.     Palpations: Abdomen is soft. There is no mass.     Tenderness: There is no abdominal tenderness. There is no guarding or rebound.     Hernia: No hernia is present.  Skin:    General: Skin is warm and dry.  Neurological:     Mental Status: He is alert and oriented to person, place, and time.   Primary care notes reviewed  Assessment  Need for screening colonoscopy Plan  Patient is scheduled for screening colonoscopy on 04/20/2024.  The risks and benefits of the procedure including bleeding and perforation were fully explained to the patient, who gave informed consent.  Sutabs have been prescribed for bowel preparation.

## 2024-03-25 NOTE — H&P (Signed)
 Luis Fitzgerald; [id number]; 1955/10/09   HPI Patient is a 68 year old white male who was referred to my care by Deward Soja for a screening colonoscopy.  He last had a colonoscopy over 10 years ago.  He denies any family history of colon cancer, blood in his stools, abnormal diarrhea or constipation, or weight change.  He does have a history of a melanoma of his left arm resulting in lymphedema of the left arm. Past Medical History:  Diagnosis Date   Cancer (HCC)    melanoma   Diabetes mellitus without complication (HCC)    Lymphedema    left arm   Melanoma (HCC)    left arm    Past Surgical History:  Procedure Laterality Date   EYE SURGERY Left    knee arthroscope Left    TOTAL KNEE ARTHROPLASTY Left 10/14/2023   Procedure: LEFT TOTAL KNEE ARTHROPLASTY;  Surgeon: Vernetta Lonni GRADE, MD;  Location: [facility] OR;  Service: Orthopedics;  Laterality: Left;    History reviewed. No pertinent family history.  Current Outpatient Medications on File Prior to Visit  Medication Sig Dispense Refill   acetaminophen  (TYLENOL ) 500 MG tablet Take 500-1,000 mg by mouth every 8 (eight) hours as needed for mild pain (pain score 1-3) or moderate pain (pain score 4-6).     aspirin  81 MG chewable tablet Chew 1 tablet (81 mg total) by mouth 2 (two) times daily. 30 tablet 0   atorvastatin (LIPITOR) 10 MG tablet Take 10 mg by mouth at bedtime.     celecoxib (CELEBREX) 200 MG capsule Take 200 mg by mouth in the morning.     glipiZIDE  (GLUCOTROL ) 10 MG tablet Take 5 mg by mouth 2 (two) times daily before a meal.     metFORMIN  (GLUCOPHAGE -XR) 500 MG 24 hr tablet Take 1,000 mg by mouth 2 (two) times daily with a meal.     OZEMPIC, 0.25 OR 0.5 MG/DOSE, 2 MG/3ML SOPN Inject 0.5 mg into the skin once a week.     No current facility-administered medications on file prior to visit.    Allergies  Allergen Reactions   Other Other (See Comments)    Pecans -no other nuts    Social History   Substance and  Sexual Activity  Alcohol  Use No    Social History   Tobacco Use  Smoking Status Never  Smokeless Tobacco Never    Review of Systems  Constitutional: Negative.   HENT: Negative.    Eyes: Negative.   Respiratory: Negative.    Cardiovascular: Negative.   Gastrointestinal: Negative.   Genitourinary:  Positive for urgency.  Musculoskeletal: Negative.   Skin: Negative.   Neurological: Negative.   Endo/Heme/Allergies: Negative.   Psychiatric/Behavioral: Negative.      Objective   Vitals:   03/25/24 1010  BP: 134/85  Pulse: 70  Resp: 14  Temp: 98 F (36.7 C)  SpO2: 96%    Physical Exam Vitals reviewed.  Constitutional:      Appearance: Normal appearance. He is not ill-appearing.  HENT:     Head: Normocephalic and atraumatic.  Cardiovascular:     Rate and Rhythm: Normal rate and regular rhythm.     Heart sounds: Normal heart sounds. No murmur heard.    No friction rub. No gallop.  Pulmonary:     Effort: Pulmonary effort is normal. No respiratory distress.     Breath sounds: Normal breath sounds. No stridor. No wheezing or rhonchi.  Abdominal:     General: Bowel sounds are normal.  There is no distension.     Palpations: Abdomen is soft. There is no mass.     Tenderness: There is no abdominal tenderness. There is no guarding or rebound.     Hernia: No hernia is present.  Skin:    General: Skin is warm and dry.  Neurological:     Mental Status: He is alert and oriented to person, place, and time.   Primary care notes reviewed  Assessment  Need for screening colonoscopy Plan  Patient is scheduled for screening colonoscopy on 04/20/2024.  The risks and benefits of the procedure including bleeding and perforation were fully explained to the patient, who gave informed consent.  Sutabs have been prescribed for bowel preparation.

## 2024-03-26 DIAGNOSIS — M9902 Segmental and somatic dysfunction of thoracic region: Secondary | ICD-10-CM | POA: Diagnosis not present

## 2024-03-26 DIAGNOSIS — M9901 Segmental and somatic dysfunction of cervical region: Secondary | ICD-10-CM | POA: Diagnosis not present

## 2024-03-26 DIAGNOSIS — M546 Pain in thoracic spine: Secondary | ICD-10-CM | POA: Diagnosis not present

## 2024-03-26 DIAGNOSIS — M542 Cervicalgia: Secondary | ICD-10-CM | POA: Diagnosis not present

## 2024-03-26 DIAGNOSIS — M9903 Segmental and somatic dysfunction of lumbar region: Secondary | ICD-10-CM | POA: Diagnosis not present

## 2024-03-26 DIAGNOSIS — M62830 Muscle spasm of back: Secondary | ICD-10-CM | POA: Diagnosis not present

## 2024-03-26 DIAGNOSIS — M9905 Segmental and somatic dysfunction of pelvic region: Secondary | ICD-10-CM | POA: Diagnosis not present

## 2024-03-26 DIAGNOSIS — M25562 Pain in left knee: Secondary | ICD-10-CM | POA: Diagnosis not present

## 2024-04-14 DIAGNOSIS — M9905 Segmental and somatic dysfunction of pelvic region: Secondary | ICD-10-CM | POA: Diagnosis not present

## 2024-04-14 DIAGNOSIS — M9901 Segmental and somatic dysfunction of cervical region: Secondary | ICD-10-CM | POA: Diagnosis not present

## 2024-04-14 DIAGNOSIS — M62830 Muscle spasm of back: Secondary | ICD-10-CM | POA: Diagnosis not present

## 2024-04-14 DIAGNOSIS — M9902 Segmental and somatic dysfunction of thoracic region: Secondary | ICD-10-CM | POA: Diagnosis not present

## 2024-04-14 DIAGNOSIS — M9903 Segmental and somatic dysfunction of lumbar region: Secondary | ICD-10-CM | POA: Diagnosis not present

## 2024-04-14 DIAGNOSIS — M25562 Pain in left knee: Secondary | ICD-10-CM | POA: Diagnosis not present

## 2024-04-14 DIAGNOSIS — M546 Pain in thoracic spine: Secondary | ICD-10-CM | POA: Diagnosis not present

## 2024-04-14 DIAGNOSIS — M542 Cervicalgia: Secondary | ICD-10-CM | POA: Diagnosis not present

## 2024-04-20 ENCOUNTER — Encounter (HOSPITAL_COMMUNITY): Payer: Self-pay | Admitting: General Surgery

## 2024-04-20 ENCOUNTER — Other Ambulatory Visit: Payer: Self-pay

## 2024-04-20 ENCOUNTER — Ambulatory Visit (HOSPITAL_COMMUNITY)
Admission: RE | Admit: 2024-04-20 | Discharge: 2024-04-20 | Disposition: A | Discharge status: Home / Self Care | Attending: General Surgery | Admitting: General Surgery

## 2024-04-20 ENCOUNTER — Ambulatory Visit (HOSPITAL_COMMUNITY): Admitting: Certified Registered Nurse Anesthetist

## 2024-04-20 ENCOUNTER — Encounter (HOSPITAL_COMMUNITY)
Admission: RE | Disposition: A | Discharge status: Home / Self Care | Payer: Self-pay | Source: Home / Self Care | Attending: General Surgery

## 2024-04-20 DIAGNOSIS — E119 Type 2 diabetes mellitus without complications: Secondary | ICD-10-CM

## 2024-04-20 DIAGNOSIS — Z1211 Encounter for screening for malignant neoplasm of colon: Secondary | ICD-10-CM

## 2024-04-20 DIAGNOSIS — D128 Benign neoplasm of rectum: Secondary | ICD-10-CM | POA: Diagnosis not present

## 2024-04-20 DIAGNOSIS — Z79899 Other long term (current) drug therapy: Secondary | ICD-10-CM | POA: Insufficient documentation

## 2024-04-20 DIAGNOSIS — K621 Rectal polyp: Secondary | ICD-10-CM

## 2024-04-20 DIAGNOSIS — Z7984 Long term (current) use of oral hypoglycemic drugs: Secondary | ICD-10-CM | POA: Diagnosis not present

## 2024-04-20 DIAGNOSIS — Z7982 Long term (current) use of aspirin: Secondary | ICD-10-CM | POA: Insufficient documentation

## 2024-04-20 DIAGNOSIS — Z7985 Long-term (current) use of injectable non-insulin antidiabetic drugs: Secondary | ICD-10-CM | POA: Insufficient documentation

## 2024-04-20 HISTORY — PX: COLONOSCOPY: SHX5424

## 2024-04-20 LAB — GLUCOSE, CAPILLARY
Glucose-Capillary: 91 mg/dL (ref 70–99)
Glucose-Capillary: 94 mg/dL (ref 70–99)

## 2024-04-20 SURGERY — COLONOSCOPY
Anesthesia: General

## 2024-04-20 MED ORDER — PROPOFOL 500 MG/50ML IV EMUL
INTRAVENOUS | Status: DC | PRN
  Administered 2024-04-20: 100 mg via INTRAVENOUS
  Administered 2024-04-20: 150 ug/kg/min via INTRAVENOUS

## 2024-04-20 MED ORDER — LACTATED RINGERS IV SOLN: INTRAVENOUS | Status: DC | PRN

## 2024-04-20 NOTE — Interval H&P Note (Signed)
 History and Physical Interval Note:  04/20/2024 7:07 AM  Luis Fitzgerald  has presented today for surgery, with the diagnosis of SCREENING FOR COLON CANCER.  The various methods of treatment have been discussed with the patient and family. After consideration of risks, benefits and other options for treatment, the patient has consented to  Procedure(s) with comments: COLONOSCOPY (N/A) - W/ PROPOFOL  as a surgical intervention.  The patient's history has been reviewed, patient examined, no change in status, stable for surgery.  I have reviewed the patient's chart and labs.  Questions were answered to the patient's satisfaction.     [name]

## 2024-04-20 NOTE — Anesthesia Preprocedure Evaluation (Signed)
 Anesthesia Evaluation  Patient identified by MRN, date of birth, ID band Patient awake    Reviewed: Allergy & Precautions, NPO status   Airway Mallampati: I  TM Distance: >3 FB Neck ROM: Full    Dental no notable dental hx.    Pulmonary    Pulmonary exam normal        Cardiovascular Normal cardiovascular exam     Neuro/Psych    GI/Hepatic   Endo/Other  diabetes    Renal/GU      Musculoskeletal   Abdominal Normal abdominal exam  (+)   Peds  Hematology   Anesthesia Other Findings   Reproductive/Obstetrics                              Anesthesia Physical Anesthesia Plan  ASA: 2  Anesthesia Plan: General   Post-op Pain Management:    Induction: Intravenous  PONV Risk Score and Plan: Propofol  infusion  Airway Management Planned: Natural Airway and Nasal Cannula  Additional Equipment:   Intra-op Plan:   Post-operative Plan:   Informed Consent: I have reviewed the patients History and Physical, chart, labs and discussed the procedure including the risks, benefits and alternatives for the proposed anesthesia with the patient or authorized representative who has indicated his/her understanding and acceptance.     Dental advisory given  Plan Discussed with: Anesthesiologist  Anesthesia Plan Comments:          Anesthesia Quick Evaluation

## 2024-04-20 NOTE — Transfer of Care (Signed)
 Immediate Anesthesia Transfer of Care Note  Patient: Luis Fitzgerald  Procedure(s) Performed: COLONOSCOPY  Patient Location: Endoscopy Unit  Anesthesia Type:General  Level of Consciousness: drowsy  Airway & Oxygen Therapy: Patient Spontanous Breathing  Post-op Assessment: Report given to RN and Post -op Vital signs reviewed and stable  Post vital signs: Reviewed and stable  Last Vitals:  Vitals Value Taken Time  BP 103/51 04/20/24 07:45  Temp 36.5 C 04/20/24 07:45  Pulse 61 04/20/24 07:45  Resp 13 04/20/24 07:45  SpO2 94 % 04/20/24 07:45    Last Pain:  Vitals:   04/20/24 0745  TempSrc: Oral  PainSc: 0-No pain      Patients Stated Pain Goal: 5 (04/20/24 9341)  Complications: No notable events documented.

## 2024-04-20 NOTE — Op Note (Signed)
 [facility] Patient Name: Luis Fitzgerald Procedure Date: 04/20/2024 6:45 AM MRN: [id number] Date of Birth: 01-Feb-1956 Attending MD: [name] , MD, [id number] CSN: [id number] Age: 68 Admit Type: Outpatient Procedure:                Colonoscopy Indications:              Screening for colorectal malignant neoplasm Providers:                [name], MD, Crystal Page, Dorcas Lenis,                            Technician Referring MD:             [name], MD Medicines:                Propofol  per Anesthesia Complications:            No immediate complications. Estimated Blood Loss:     Estimated blood loss: none. Procedure:                Pre-Anesthesia Assessment:                           - Prior to the procedure, a History and Physical                            was performed, and patient medications and                            allergies were reviewed. The patient is competent.                            The risks and benefits of the procedure and the                            sedation options and risks were discussed with the                            patient. All questions were answered and informed                            consent was obtained. Patient identification and                            proposed procedure were verified by the physician,                            the nurse, the anesthetist and the technician in                            the procedure room. Mental Status Examination:                            alert and oriented. Airway Examination: normal  oropharyngeal airway and neck mobility. Respiratory                            Examination: clear to auscultation. CV Examination:                            RRR, no murmurs, no S3 or S4. Prophylactic                            Antibiotics: The patient does not require                            prophylactic antibiotics. Prior Anticoagulants: The                             patient has taken no anticoagulant or antiplatelet                            agents except for aspirin . ASA Grade Assessment: II                            - A patient with mild systemic disease. After                            reviewing the risks and benefits, the patient was                            deemed in satisfactory condition to undergo the                            procedure. The anesthesia plan was to use deep                            sedation / analgesia. Immediately prior to                            administration of medications, the patient was                            re-assessed for adequacy to receive sedatives. The                            heart rate, respiratory rate, oxygen saturations,                            blood pressure, adequacy of pulmonary ventilation,                            and response to care were monitored throughout the                            procedure. The physical status of the patient was  re-assessed after the procedure.                           After obtaining informed consent, the colonoscope                            was passed under direct vision. Throughout the                            procedure, the patient's blood pressure, pulse, and                            oxygen saturations were monitored continuously. The                            [id number] ([id number]) Colon was introduced through                            the anus and advanced to the the cecum, identified                            by the appendiceal orifice, ileocecal valve and                            palpation. No anatomical landmarks were                            photographed. The entire colon was examined. The                            colonoscopy was performed without difficulty. The                            patient tolerated the procedure well. The quality                            of the bowel preparation was  excellent. The total                            duration of the procedure was 18 minutes. Scope In: 7:20:23 AM Scope Out: 7:39:10 AM Scope Withdrawal Time: 0 hours 5 minutes 57 seconds  Total Procedure Duration: 0 hours 18 minutes 47 seconds  Findings:      The perianal and digital rectal examinations were normal.      A 2 mm polyp was found in the distal rectum. The polyp was sessile. The       polyp was removed with a hot snare. Resection was complete, but the       polyp tissue was not retrieved. Estimated blood loss: none.      The entire examined colon appeared normal on direct and retroflexion       views. Impression:               - One 2 mm polyp in the distal rectum, removed with  a hot snare. Complete resection. Polyp tissue not                            retrieved.                           - The entire examined colon is normal on direct and                            retroflexion views. Moderate Sedation:      Moderate (conscious) sedation was administered by the nurse and       supervised by the endoscopist. The patient's oxygen saturation, heart       rate, blood pressure and response to care were monitored. Recommendation:           - Written discharge instructions were provided to                            the patient.                           - The signs and symptoms of potential delayed                            complications were discussed with the patient.                           - Patient has a contact number available for                            emergencies.                           - Return to normal activities tomorrow.                           - Resume previous diet.                           - Continue present medications.                           - No repeat colonoscopy due to age. Procedure Code(s):        --- Professional ---                           [id number], Colonoscopy, flexible; with removal of                             tumor(s), polyp(s), or other lesion(s) by snare                            technique Diagnosis Code(s):        --- Professional ---                           Z12.11, Encounter for screening for malignant  neoplasm of colon                           D12.8, Benign neoplasm of rectum CPT copyright 2022 [employer]. All rights reserved. The codes documented in this report are preliminary and upon coder review may  be revised to meet current compliance requirements. [name], MD [name], MD 04/20/2024 7:44:20 AM This report has been signed electronically. Number of Addenda: 0

## 2024-04-21 ENCOUNTER — Encounter (HOSPITAL_COMMUNITY): Payer: Self-pay | Admitting: General Surgery

## 2024-04-22 NOTE — Anesthesia Postprocedure Evaluation (Signed)
 Anesthesia Post Note  Patient: Luis Fitzgerald  Procedure(s) Performed: COLONOSCOPY  Patient location during evaluation: Phase II Anesthesia Type: General Level of consciousness: awake Pain management: pain level controlled Vital Signs Assessment: post-procedure vital signs reviewed and stable Respiratory status: spontaneous breathing and respiratory function stable Cardiovascular status: blood pressure returned to baseline and stable Postop Assessment: no headache and no apparent nausea or vomiting Anesthetic complications: no Comments: Late entry   No notable events documented.   Last Vitals:  Vitals:   04/20/24 0658 04/20/24 0745  BP: 133/75 (!) 103/51  Pulse: 80 61  Resp: 12 13  Temp: 36.9 C 36.5 C  SpO2: 97% 94%    Last Pain:  Vitals:   04/20/24 0745  TempSrc: Oral  PainSc: 0-No pain                 Yvonna JINNY Bosworth

## 2024-05-04 NOTE — Progress Notes (Signed)
 Luis Fitzgerald                                          MRN: [id number]   05/04/2024   The [facility] Quality Team Specialist reviewed this patient medical record for the purposes of chart review for care gap closure. The following were reviewed: chart review for care gap closure-kidney health evaluation for diabetes:uACR.    [facility] Quality Team

## 2024-05-12 DIAGNOSIS — M9902 Segmental and somatic dysfunction of thoracic region: Secondary | ICD-10-CM | POA: Diagnosis not present

## 2024-05-12 DIAGNOSIS — M542 Cervicalgia: Secondary | ICD-10-CM | POA: Diagnosis not present

## 2024-05-12 DIAGNOSIS — M9903 Segmental and somatic dysfunction of lumbar region: Secondary | ICD-10-CM | POA: Diagnosis not present

## 2024-05-12 DIAGNOSIS — M62830 Muscle spasm of back: Secondary | ICD-10-CM | POA: Diagnosis not present

## 2024-05-12 DIAGNOSIS — M546 Pain in thoracic spine: Secondary | ICD-10-CM | POA: Diagnosis not present

## 2024-05-12 DIAGNOSIS — M9901 Segmental and somatic dysfunction of cervical region: Secondary | ICD-10-CM | POA: Diagnosis not present

## 2024-05-12 DIAGNOSIS — M9905 Segmental and somatic dysfunction of pelvic region: Secondary | ICD-10-CM | POA: Diagnosis not present

## 2024-05-12 DIAGNOSIS — M25562 Pain in left knee: Secondary | ICD-10-CM | POA: Diagnosis not present

## 2024-05-31 ENCOUNTER — Encounter: Payer: Self-pay | Admitting: Radiology

## 2024-08-25 ENCOUNTER — Ambulatory Visit: Admitting: Orthopaedic Surgery

## 2024-09-08 ENCOUNTER — Ambulatory Visit: Admitting: Orthopaedic Surgery
